# Patient Record
Sex: Male | Born: 1955
Health system: Southern US, Community
[De-identification: ages and names within clinical notes are randomized; demographics above are authoritative.]

---

## 2007-09-17 ENCOUNTER — Ambulatory Visit: Payer: Self-pay | Admitting: Infectious Diseases

## 2007-12-17 ENCOUNTER — Ambulatory Visit: Payer: Self-pay | Admitting: Internal Medicine

## 2008-06-23 ENCOUNTER — Ambulatory Visit: Payer: Self-pay | Admitting: Infectious Diseases

## 2008-12-15 ENCOUNTER — Ambulatory Visit: Payer: Self-pay | Admitting: Internal Medicine

## 2012-03-05 DIAGNOSIS — B20 Human immunodeficiency virus [HIV] disease: Secondary | ICD-10-CM

## 2012-04-08 DIAGNOSIS — B20 Human immunodeficiency virus [HIV] disease: Secondary | ICD-10-CM

## 2012-08-06 DIAGNOSIS — B20 Human immunodeficiency virus [HIV] disease: Secondary | ICD-10-CM

## 2013-02-10 DIAGNOSIS — B20 Human immunodeficiency virus [HIV] disease: Secondary | ICD-10-CM

## 2014-01-06 DIAGNOSIS — B20 Human immunodeficiency virus [HIV] disease: Secondary | ICD-10-CM

## 2014-05-26 DIAGNOSIS — B20 Human immunodeficiency virus [HIV] disease: Secondary | ICD-10-CM

## 2014-06-29 DIAGNOSIS — S42024A Nondisplaced fracture of shaft of right clavicle, initial encounter for closed fracture: Secondary | ICD-10-CM | POA: Diagnosis not present

## 2014-07-12 DIAGNOSIS — I82401 Acute embolism and thrombosis of unspecified deep veins of right lower extremity: Secondary | ICD-10-CM | POA: Diagnosis not present

## 2014-07-12 DIAGNOSIS — E785 Hyperlipidemia, unspecified: Secondary | ICD-10-CM | POA: Diagnosis not present

## 2014-07-12 DIAGNOSIS — E1140 Type 2 diabetes mellitus with diabetic neuropathy, unspecified: Secondary | ICD-10-CM | POA: Diagnosis not present

## 2014-07-12 DIAGNOSIS — E119 Type 2 diabetes mellitus without complications: Secondary | ICD-10-CM | POA: Diagnosis not present

## 2014-07-19 DIAGNOSIS — I82401 Acute embolism and thrombosis of unspecified deep veins of right lower extremity: Secondary | ICD-10-CM | POA: Diagnosis not present

## 2014-07-26 DIAGNOSIS — E1142 Type 2 diabetes mellitus with diabetic polyneuropathy: Secondary | ICD-10-CM | POA: Diagnosis not present

## 2014-07-26 DIAGNOSIS — Z7689 Persons encountering health services in other specified circumstances: Secondary | ICD-10-CM | POA: Diagnosis not present

## 2014-07-26 DIAGNOSIS — G894 Chronic pain syndrome: Secondary | ICD-10-CM | POA: Diagnosis not present

## 2014-07-26 DIAGNOSIS — Z720 Tobacco use: Secondary | ICD-10-CM | POA: Diagnosis not present

## 2014-07-26 DIAGNOSIS — Z5181 Encounter for therapeutic drug level monitoring: Secondary | ICD-10-CM | POA: Diagnosis not present

## 2014-07-26 DIAGNOSIS — M961 Postlaminectomy syndrome, not elsewhere classified: Secondary | ICD-10-CM | POA: Diagnosis not present

## 2014-07-27 DIAGNOSIS — S42024A Nondisplaced fracture of shaft of right clavicle, initial encounter for closed fracture: Secondary | ICD-10-CM | POA: Diagnosis not present

## 2014-09-07 DIAGNOSIS — S42024A Nondisplaced fracture of shaft of right clavicle, initial encounter for closed fracture: Secondary | ICD-10-CM | POA: Diagnosis not present

## 2014-09-22 DIAGNOSIS — M961 Postlaminectomy syndrome, not elsewhere classified: Secondary | ICD-10-CM | POA: Diagnosis not present

## 2014-09-22 DIAGNOSIS — Z720 Tobacco use: Secondary | ICD-10-CM | POA: Diagnosis not present

## 2014-09-22 DIAGNOSIS — E1142 Type 2 diabetes mellitus with diabetic polyneuropathy: Secondary | ICD-10-CM | POA: Diagnosis not present

## 2014-10-10 DIAGNOSIS — E785 Hyperlipidemia, unspecified: Secondary | ICD-10-CM | POA: Diagnosis not present

## 2014-10-10 DIAGNOSIS — E119 Type 2 diabetes mellitus without complications: Secondary | ICD-10-CM | POA: Diagnosis not present

## 2014-10-10 DIAGNOSIS — I82409 Acute embolism and thrombosis of unspecified deep veins of unspecified lower extremity: Secondary | ICD-10-CM | POA: Diagnosis not present

## 2014-10-10 DIAGNOSIS — E1129 Type 2 diabetes mellitus with other diabetic kidney complication: Secondary | ICD-10-CM | POA: Diagnosis not present

## 2014-10-24 DIAGNOSIS — I82409 Acute embolism and thrombosis of unspecified deep veins of unspecified lower extremity: Secondary | ICD-10-CM | POA: Diagnosis not present

## 2014-11-01 DIAGNOSIS — Z23 Encounter for immunization: Secondary | ICD-10-CM | POA: Diagnosis not present

## 2014-11-01 DIAGNOSIS — S91112A Laceration without foreign body of left great toe without damage to nail, initial encounter: Secondary | ICD-10-CM | POA: Diagnosis not present

## 2014-11-03 DIAGNOSIS — Z79899 Other long term (current) drug therapy: Secondary | ICD-10-CM | POA: Diagnosis not present

## 2014-11-03 DIAGNOSIS — B20 Human immunodeficiency virus [HIV] disease: Secondary | ICD-10-CM | POA: Diagnosis not present

## 2014-11-08 DIAGNOSIS — I639 Cerebral infarction, unspecified: Secondary | ICD-10-CM | POA: Diagnosis not present

## 2014-11-10 DIAGNOSIS — Z7901 Long term (current) use of anticoagulants: Secondary | ICD-10-CM | POA: Diagnosis not present

## 2014-11-10 DIAGNOSIS — L089 Local infection of the skin and subcutaneous tissue, unspecified: Secondary | ICD-10-CM | POA: Diagnosis not present

## 2014-11-10 DIAGNOSIS — Z87891 Personal history of nicotine dependence: Secondary | ICD-10-CM | POA: Diagnosis not present

## 2014-11-10 DIAGNOSIS — B20 Human immunodeficiency virus [HIV] disease: Secondary | ICD-10-CM | POA: Diagnosis not present

## 2014-11-10 DIAGNOSIS — E1142 Type 2 diabetes mellitus with diabetic polyneuropathy: Secondary | ICD-10-CM | POA: Diagnosis not present

## 2014-11-10 DIAGNOSIS — Z86718 Personal history of other venous thrombosis and embolism: Secondary | ICD-10-CM | POA: Diagnosis not present

## 2014-11-10 DIAGNOSIS — E781 Pure hyperglyceridemia: Secondary | ICD-10-CM | POA: Diagnosis not present

## 2014-11-10 DIAGNOSIS — R42 Dizziness and giddiness: Secondary | ICD-10-CM | POA: Diagnosis not present

## 2014-11-11 DIAGNOSIS — I639 Cerebral infarction, unspecified: Secondary | ICD-10-CM | POA: Diagnosis not present

## 2014-11-11 DIAGNOSIS — B20 Human immunodeficiency virus [HIV] disease: Secondary | ICD-10-CM | POA: Diagnosis not present

## 2014-11-15 DIAGNOSIS — F17210 Nicotine dependence, cigarettes, uncomplicated: Secondary | ICD-10-CM | POA: Diagnosis not present

## 2014-11-15 DIAGNOSIS — I82513 Chronic embolism and thrombosis of femoral vein, bilateral: Secondary | ICD-10-CM | POA: Diagnosis not present

## 2014-11-15 DIAGNOSIS — J9811 Atelectasis: Secondary | ICD-10-CM | POA: Diagnosis not present

## 2014-11-15 DIAGNOSIS — Z794 Long term (current) use of insulin: Secondary | ICD-10-CM | POA: Diagnosis not present

## 2014-11-15 DIAGNOSIS — L03115 Cellulitis of right lower limb: Secondary | ICD-10-CM | POA: Diagnosis not present

## 2014-11-15 DIAGNOSIS — Z885 Allergy status to narcotic agent status: Secondary | ICD-10-CM | POA: Diagnosis not present

## 2014-11-15 DIAGNOSIS — Z7901 Long term (current) use of anticoagulants: Secondary | ICD-10-CM | POA: Diagnosis not present

## 2014-11-15 DIAGNOSIS — Z21 Asymptomatic human immunodeficiency virus [HIV] infection status: Secondary | ICD-10-CM | POA: Diagnosis not present

## 2014-11-15 DIAGNOSIS — R062 Wheezing: Secondary | ICD-10-CM | POA: Diagnosis not present

## 2014-11-15 DIAGNOSIS — I82413 Acute embolism and thrombosis of femoral vein, bilateral: Secondary | ICD-10-CM | POA: Diagnosis not present

## 2014-11-15 DIAGNOSIS — A419 Sepsis, unspecified organism: Secondary | ICD-10-CM | POA: Diagnosis not present

## 2014-11-15 DIAGNOSIS — T148 Other injury of unspecified body region: Secondary | ICD-10-CM | POA: Diagnosis not present

## 2014-11-15 DIAGNOSIS — L02612 Cutaneous abscess of left foot: Secondary | ICD-10-CM | POA: Diagnosis not present

## 2014-11-15 DIAGNOSIS — I4510 Unspecified right bundle-branch block: Secondary | ICD-10-CM | POA: Diagnosis not present

## 2014-11-15 DIAGNOSIS — Z880 Allergy status to penicillin: Secondary | ICD-10-CM | POA: Diagnosis not present

## 2014-11-15 DIAGNOSIS — T814XXA Infection following a procedure, initial encounter: Secondary | ICD-10-CM | POA: Diagnosis not present

## 2014-11-15 DIAGNOSIS — B20 Human immunodeficiency virus [HIV] disease: Secondary | ICD-10-CM | POA: Diagnosis not present

## 2014-11-15 DIAGNOSIS — E1142 Type 2 diabetes mellitus with diabetic polyneuropathy: Secondary | ICD-10-CM | POA: Diagnosis not present

## 2014-11-15 DIAGNOSIS — L03032 Cellulitis of left toe: Secondary | ICD-10-CM | POA: Diagnosis not present

## 2014-11-15 DIAGNOSIS — L03116 Cellulitis of left lower limb: Secondary | ICD-10-CM | POA: Diagnosis not present

## 2014-11-15 DIAGNOSIS — Z79899 Other long term (current) drug therapy: Secondary | ICD-10-CM | POA: Diagnosis not present

## 2014-11-15 DIAGNOSIS — E780 Pure hypercholesterolemia: Secondary | ICD-10-CM | POA: Diagnosis not present

## 2014-11-15 DIAGNOSIS — S91112A Laceration without foreign body of left great toe without damage to nail, initial encounter: Secondary | ICD-10-CM | POA: Diagnosis not present

## 2014-11-15 DIAGNOSIS — L97529 Non-pressure chronic ulcer of other part of left foot with unspecified severity: Secondary | ICD-10-CM | POA: Diagnosis not present

## 2014-11-23 DIAGNOSIS — G8929 Other chronic pain: Secondary | ICD-10-CM | POA: Diagnosis not present

## 2014-11-23 DIAGNOSIS — Z21 Asymptomatic human immunodeficiency virus [HIV] infection status: Secondary | ICD-10-CM | POA: Diagnosis not present

## 2014-11-23 DIAGNOSIS — T383X5A Adverse effect of insulin and oral hypoglycemic [antidiabetic] drugs, initial encounter: Secondary | ICD-10-CM | POA: Diagnosis not present

## 2014-11-23 DIAGNOSIS — F329 Major depressive disorder, single episode, unspecified: Secondary | ICD-10-CM | POA: Diagnosis not present

## 2014-11-23 DIAGNOSIS — F17210 Nicotine dependence, cigarettes, uncomplicated: Secondary | ICD-10-CM | POA: Diagnosis not present

## 2014-11-23 DIAGNOSIS — L97522 Non-pressure chronic ulcer of other part of left foot with fat layer exposed: Secondary | ICD-10-CM | POA: Diagnosis not present

## 2014-11-23 DIAGNOSIS — E785 Hyperlipidemia, unspecified: Secondary | ICD-10-CM | POA: Diagnosis not present

## 2014-11-23 DIAGNOSIS — Z86718 Personal history of other venous thrombosis and embolism: Secondary | ICD-10-CM | POA: Diagnosis not present

## 2014-11-23 DIAGNOSIS — E1140 Type 2 diabetes mellitus with diabetic neuropathy, unspecified: Secondary | ICD-10-CM | POA: Diagnosis not present

## 2014-11-23 DIAGNOSIS — B20 Human immunodeficiency virus [HIV] disease: Secondary | ICD-10-CM | POA: Diagnosis not present

## 2014-11-23 DIAGNOSIS — G4733 Obstructive sleep apnea (adult) (pediatric): Secondary | ICD-10-CM | POA: Diagnosis not present

## 2014-11-23 DIAGNOSIS — L97529 Non-pressure chronic ulcer of other part of left foot with unspecified severity: Secondary | ICD-10-CM | POA: Diagnosis not present

## 2014-11-23 DIAGNOSIS — R4020 Unspecified coma: Secondary | ICD-10-CM | POA: Diagnosis not present

## 2014-11-23 DIAGNOSIS — E876 Hypokalemia: Secondary | ICD-10-CM | POA: Diagnosis not present

## 2014-11-23 DIAGNOSIS — D72829 Elevated white blood cell count, unspecified: Secondary | ICD-10-CM | POA: Diagnosis not present

## 2014-11-23 DIAGNOSIS — S91109A Unspecified open wound of unspecified toe(s) without damage to nail, initial encounter: Secondary | ICD-10-CM | POA: Diagnosis not present

## 2014-11-23 DIAGNOSIS — S0083XA Contusion of other part of head, initial encounter: Secondary | ICD-10-CM | POA: Diagnosis not present

## 2014-11-23 DIAGNOSIS — G9341 Metabolic encephalopathy: Secondary | ICD-10-CM | POA: Diagnosis not present

## 2014-11-23 DIAGNOSIS — I10 Essential (primary) hypertension: Secondary | ICD-10-CM | POA: Diagnosis not present

## 2014-11-23 DIAGNOSIS — M6282 Rhabdomyolysis: Secondary | ICD-10-CM | POA: Diagnosis not present

## 2014-11-23 DIAGNOSIS — E11649 Type 2 diabetes mellitus with hypoglycemia without coma: Secondary | ICD-10-CM | POA: Diagnosis not present

## 2014-11-23 DIAGNOSIS — E1142 Type 2 diabetes mellitus with diabetic polyneuropathy: Secondary | ICD-10-CM | POA: Diagnosis not present

## 2014-11-23 DIAGNOSIS — E162 Hypoglycemia, unspecified: Secondary | ICD-10-CM | POA: Diagnosis not present

## 2014-11-23 DIAGNOSIS — Z880 Allergy status to penicillin: Secondary | ICD-10-CM | POA: Diagnosis not present

## 2014-11-23 DIAGNOSIS — E160 Drug-induced hypoglycemia without coma: Secondary | ICD-10-CM | POA: Diagnosis not present

## 2014-11-23 DIAGNOSIS — J9811 Atelectasis: Secondary | ICD-10-CM | POA: Diagnosis not present

## 2014-11-23 DIAGNOSIS — R220 Localized swelling, mass and lump, head: Secondary | ICD-10-CM | POA: Diagnosis not present

## 2014-11-29 DIAGNOSIS — L089 Local infection of the skin and subcutaneous tissue, unspecified: Secondary | ICD-10-CM | POA: Diagnosis not present

## 2014-11-29 DIAGNOSIS — M961 Postlaminectomy syndrome, not elsewhere classified: Secondary | ICD-10-CM | POA: Diagnosis not present

## 2014-11-29 DIAGNOSIS — E1142 Type 2 diabetes mellitus with diabetic polyneuropathy: Secondary | ICD-10-CM | POA: Diagnosis not present

## 2014-11-29 DIAGNOSIS — Z720 Tobacco use: Secondary | ICD-10-CM | POA: Diagnosis not present

## 2014-12-01 DIAGNOSIS — E1142 Type 2 diabetes mellitus with diabetic polyneuropathy: Secondary | ICD-10-CM | POA: Diagnosis not present

## 2014-12-01 DIAGNOSIS — E781 Pure hyperglyceridemia: Secondary | ICD-10-CM | POA: Diagnosis not present

## 2014-12-01 DIAGNOSIS — R42 Dizziness and giddiness: Secondary | ICD-10-CM | POA: Diagnosis not present

## 2014-12-01 DIAGNOSIS — Z7901 Long term (current) use of anticoagulants: Secondary | ICD-10-CM | POA: Diagnosis not present

## 2014-12-01 DIAGNOSIS — B20 Human immunodeficiency virus [HIV] disease: Secondary | ICD-10-CM | POA: Diagnosis not present

## 2014-12-01 DIAGNOSIS — Z87891 Personal history of nicotine dependence: Secondary | ICD-10-CM | POA: Diagnosis not present

## 2014-12-01 DIAGNOSIS — Z86718 Personal history of other venous thrombosis and embolism: Secondary | ICD-10-CM | POA: Diagnosis not present

## 2014-12-01 DIAGNOSIS — L089 Local infection of the skin and subcutaneous tissue, unspecified: Secondary | ICD-10-CM | POA: Diagnosis not present

## 2014-12-02 DIAGNOSIS — B20 Human immunodeficiency virus [HIV] disease: Secondary | ICD-10-CM | POA: Diagnosis not present

## 2014-12-09 DIAGNOSIS — L03032 Cellulitis of left toe: Secondary | ICD-10-CM | POA: Diagnosis not present

## 2014-12-09 DIAGNOSIS — E119 Type 2 diabetes mellitus without complications: Secondary | ICD-10-CM | POA: Diagnosis not present

## 2014-12-23 DIAGNOSIS — E1165 Type 2 diabetes mellitus with hyperglycemia: Secondary | ICD-10-CM | POA: Diagnosis not present

## 2014-12-23 DIAGNOSIS — I82409 Acute embolism and thrombosis of unspecified deep veins of unspecified lower extremity: Secondary | ICD-10-CM | POA: Diagnosis not present

## 2014-12-26 DIAGNOSIS — I639 Cerebral infarction, unspecified: Secondary | ICD-10-CM | POA: Diagnosis not present

## 2014-12-30 DIAGNOSIS — I639 Cerebral infarction, unspecified: Secondary | ICD-10-CM | POA: Diagnosis not present

## 2014-12-30 DIAGNOSIS — I82409 Acute embolism and thrombosis of unspecified deep veins of unspecified lower extremity: Secondary | ICD-10-CM | POA: Diagnosis not present

## 2015-01-03 DIAGNOSIS — I82409 Acute embolism and thrombosis of unspecified deep veins of unspecified lower extremity: Secondary | ICD-10-CM | POA: Diagnosis not present

## 2015-01-03 DIAGNOSIS — I639 Cerebral infarction, unspecified: Secondary | ICD-10-CM | POA: Diagnosis not present

## 2015-01-16 DIAGNOSIS — I82502 Chronic embolism and thrombosis of unspecified deep veins of left lower extremity: Secondary | ICD-10-CM | POA: Diagnosis not present

## 2015-01-20 DIAGNOSIS — I82502 Chronic embolism and thrombosis of unspecified deep veins of left lower extremity: Secondary | ICD-10-CM | POA: Diagnosis not present

## 2015-01-25 DIAGNOSIS — I82502 Chronic embolism and thrombosis of unspecified deep veins of left lower extremity: Secondary | ICD-10-CM | POA: Diagnosis not present

## 2015-01-30 DIAGNOSIS — G894 Chronic pain syndrome: Secondary | ICD-10-CM | POA: Diagnosis not present

## 2015-01-30 DIAGNOSIS — Z7689 Persons encountering health services in other specified circumstances: Secondary | ICD-10-CM | POA: Diagnosis not present

## 2015-01-31 DIAGNOSIS — I82502 Chronic embolism and thrombosis of unspecified deep veins of left lower extremity: Secondary | ICD-10-CM | POA: Diagnosis not present

## 2015-03-27 DIAGNOSIS — I82511 Chronic embolism and thrombosis of right femoral vein: Secondary | ICD-10-CM | POA: Diagnosis not present

## 2015-03-27 DIAGNOSIS — E1169 Type 2 diabetes mellitus with other specified complication: Secondary | ICD-10-CM | POA: Diagnosis not present

## 2015-03-27 DIAGNOSIS — E1129 Type 2 diabetes mellitus with other diabetic kidney complication: Secondary | ICD-10-CM | POA: Diagnosis not present

## 2015-03-27 DIAGNOSIS — R079 Chest pain, unspecified: Secondary | ICD-10-CM | POA: Diagnosis not present

## 2015-03-27 DIAGNOSIS — Z23 Encounter for immunization: Secondary | ICD-10-CM | POA: Diagnosis not present

## 2015-03-27 DIAGNOSIS — F330 Major depressive disorder, recurrent, mild: Secondary | ICD-10-CM | POA: Diagnosis not present

## 2015-03-27 DIAGNOSIS — R809 Proteinuria, unspecified: Secondary | ICD-10-CM | POA: Diagnosis not present

## 2015-03-28 DIAGNOSIS — E1142 Type 2 diabetes mellitus with diabetic polyneuropathy: Secondary | ICD-10-CM | POA: Diagnosis not present

## 2015-03-28 DIAGNOSIS — F17200 Nicotine dependence, unspecified, uncomplicated: Secondary | ICD-10-CM | POA: Diagnosis not present

## 2015-03-28 DIAGNOSIS — M961 Postlaminectomy syndrome, not elsewhere classified: Secondary | ICD-10-CM | POA: Diagnosis not present

## 2015-05-17 DIAGNOSIS — F17210 Nicotine dependence, cigarettes, uncomplicated: Secondary | ICD-10-CM | POA: Diagnosis not present

## 2015-05-17 DIAGNOSIS — R55 Syncope and collapse: Secondary | ICD-10-CM | POA: Diagnosis not present

## 2015-05-17 DIAGNOSIS — Z79899 Other long term (current) drug therapy: Secondary | ICD-10-CM | POA: Diagnosis not present

## 2015-05-17 DIAGNOSIS — R079 Chest pain, unspecified: Secondary | ICD-10-CM | POA: Diagnosis not present

## 2015-05-17 DIAGNOSIS — E1165 Type 2 diabetes mellitus with hyperglycemia: Secondary | ICD-10-CM | POA: Diagnosis not present

## 2015-05-17 DIAGNOSIS — Z7901 Long term (current) use of anticoagulants: Secondary | ICD-10-CM | POA: Diagnosis not present

## 2015-05-17 DIAGNOSIS — Z7984 Long term (current) use of oral hypoglycemic drugs: Secondary | ICD-10-CM | POA: Diagnosis not present

## 2015-05-17 DIAGNOSIS — I82513 Chronic embolism and thrombosis of femoral vein, bilateral: Secondary | ICD-10-CM | POA: Diagnosis not present

## 2015-05-17 DIAGNOSIS — B20 Human immunodeficiency virus [HIV] disease: Secondary | ICD-10-CM | POA: Diagnosis not present

## 2015-05-17 DIAGNOSIS — E1142 Type 2 diabetes mellitus with diabetic polyneuropathy: Secondary | ICD-10-CM | POA: Diagnosis not present

## 2015-05-17 DIAGNOSIS — R0602 Shortness of breath: Secondary | ICD-10-CM | POA: Diagnosis not present

## 2015-05-17 DIAGNOSIS — E784 Other hyperlipidemia: Secondary | ICD-10-CM | POA: Diagnosis not present

## 2015-05-17 DIAGNOSIS — Z716 Tobacco abuse counseling: Secondary | ICD-10-CM | POA: Diagnosis not present

## 2015-05-17 DIAGNOSIS — E1129 Type 2 diabetes mellitus with other diabetic kidney complication: Secondary | ICD-10-CM | POA: Diagnosis not present

## 2015-05-17 DIAGNOSIS — G4733 Obstructive sleep apnea (adult) (pediatric): Secondary | ICD-10-CM | POA: Diagnosis not present

## 2015-05-17 DIAGNOSIS — R072 Precordial pain: Secondary | ICD-10-CM | POA: Diagnosis not present

## 2015-05-17 DIAGNOSIS — R809 Proteinuria, unspecified: Secondary | ICD-10-CM | POA: Diagnosis not present

## 2015-05-18 DIAGNOSIS — Z7901 Long term (current) use of anticoagulants: Secondary | ICD-10-CM | POA: Diagnosis not present

## 2015-05-18 DIAGNOSIS — R55 Syncope and collapse: Secondary | ICD-10-CM | POA: Diagnosis not present

## 2015-05-18 DIAGNOSIS — R079 Chest pain, unspecified: Secondary | ICD-10-CM | POA: Diagnosis not present

## 2015-05-18 DIAGNOSIS — I82413 Acute embolism and thrombosis of femoral vein, bilateral: Secondary | ICD-10-CM | POA: Diagnosis not present

## 2015-05-19 DIAGNOSIS — R079 Chest pain, unspecified: Secondary | ICD-10-CM | POA: Diagnosis not present

## 2015-05-19 DIAGNOSIS — I82513 Chronic embolism and thrombosis of femoral vein, bilateral: Secondary | ICD-10-CM | POA: Diagnosis not present

## 2015-05-19 DIAGNOSIS — E1142 Type 2 diabetes mellitus with diabetic polyneuropathy: Secondary | ICD-10-CM | POA: Diagnosis not present

## 2015-05-19 DIAGNOSIS — R55 Syncope and collapse: Secondary | ICD-10-CM | POA: Diagnosis not present

## 2015-05-19 DIAGNOSIS — B20 Human immunodeficiency virus [HIV] disease: Secondary | ICD-10-CM | POA: Diagnosis not present

## 2015-05-19 DIAGNOSIS — R0602 Shortness of breath: Secondary | ICD-10-CM | POA: Diagnosis not present

## 2015-05-31 DIAGNOSIS — M961 Postlaminectomy syndrome, not elsewhere classified: Secondary | ICD-10-CM | POA: Diagnosis not present

## 2015-05-31 DIAGNOSIS — E1142 Type 2 diabetes mellitus with diabetic polyneuropathy: Secondary | ICD-10-CM | POA: Diagnosis not present

## 2015-05-31 DIAGNOSIS — F17200 Nicotine dependence, unspecified, uncomplicated: Secondary | ICD-10-CM | POA: Diagnosis not present

## 2015-06-29 DIAGNOSIS — Z0000 Encounter for general adult medical examination without abnormal findings: Secondary | ICD-10-CM | POA: Diagnosis not present

## 2015-06-29 DIAGNOSIS — E1140 Type 2 diabetes mellitus with diabetic neuropathy, unspecified: Secondary | ICD-10-CM | POA: Diagnosis not present

## 2015-06-29 DIAGNOSIS — N400 Enlarged prostate without lower urinary tract symptoms: Secondary | ICD-10-CM | POA: Diagnosis not present

## 2015-06-29 DIAGNOSIS — E7800 Pure hypercholesterolemia, unspecified: Secondary | ICD-10-CM | POA: Diagnosis not present

## 2015-06-29 DIAGNOSIS — Z87891 Personal history of nicotine dependence: Secondary | ICD-10-CM | POA: Diagnosis not present

## 2015-06-29 DIAGNOSIS — I82511 Chronic embolism and thrombosis of right femoral vein: Secondary | ICD-10-CM | POA: Diagnosis not present

## 2015-06-29 DIAGNOSIS — I10 Essential (primary) hypertension: Secondary | ICD-10-CM | POA: Diagnosis not present

## 2015-07-10 DIAGNOSIS — E7800 Pure hypercholesterolemia, unspecified: Secondary | ICD-10-CM | POA: Diagnosis not present

## 2015-07-10 DIAGNOSIS — E1140 Type 2 diabetes mellitus with diabetic neuropathy, unspecified: Secondary | ICD-10-CM | POA: Diagnosis not present

## 2015-07-10 DIAGNOSIS — N400 Enlarged prostate without lower urinary tract symptoms: Secondary | ICD-10-CM | POA: Diagnosis not present

## 2015-07-10 DIAGNOSIS — B20 Human immunodeficiency virus [HIV] disease: Secondary | ICD-10-CM | POA: Diagnosis not present

## 2015-07-10 DIAGNOSIS — I82511 Chronic embolism and thrombosis of right femoral vein: Secondary | ICD-10-CM | POA: Diagnosis not present

## 2015-07-10 DIAGNOSIS — Z79899 Other long term (current) drug therapy: Secondary | ICD-10-CM | POA: Diagnosis not present

## 2015-07-26 DIAGNOSIS — K5900 Constipation, unspecified: Secondary | ICD-10-CM | POA: Diagnosis not present

## 2015-07-26 DIAGNOSIS — E1142 Type 2 diabetes mellitus with diabetic polyneuropathy: Secondary | ICD-10-CM | POA: Diagnosis not present

## 2015-07-26 DIAGNOSIS — M961 Postlaminectomy syndrome, not elsewhere classified: Secondary | ICD-10-CM | POA: Diagnosis not present

## 2015-08-29 DIAGNOSIS — K5904 Chronic idiopathic constipation: Secondary | ICD-10-CM | POA: Diagnosis not present

## 2015-08-29 DIAGNOSIS — Z800 Family history of malignant neoplasm of digestive organs: Secondary | ICD-10-CM | POA: Diagnosis not present

## 2015-09-21 DIAGNOSIS — B20 Human immunodeficiency virus [HIV] disease: Secondary | ICD-10-CM | POA: Diagnosis not present

## 2015-09-27 DIAGNOSIS — E1142 Type 2 diabetes mellitus with diabetic polyneuropathy: Secondary | ICD-10-CM | POA: Diagnosis not present

## 2015-09-27 DIAGNOSIS — M961 Postlaminectomy syndrome, not elsewhere classified: Secondary | ICD-10-CM | POA: Diagnosis not present

## 2015-09-28 DIAGNOSIS — K648 Other hemorrhoids: Secondary | ICD-10-CM | POA: Diagnosis not present

## 2015-09-28 DIAGNOSIS — Z800 Family history of malignant neoplasm of digestive organs: Secondary | ICD-10-CM | POA: Diagnosis not present

## 2015-09-28 DIAGNOSIS — B20 Human immunodeficiency virus [HIV] disease: Secondary | ICD-10-CM | POA: Diagnosis not present

## 2015-09-28 DIAGNOSIS — D125 Benign neoplasm of sigmoid colon: Secondary | ICD-10-CM | POA: Diagnosis not present

## 2015-09-28 DIAGNOSIS — D124 Benign neoplasm of descending colon: Secondary | ICD-10-CM | POA: Diagnosis not present

## 2015-09-28 DIAGNOSIS — K5904 Chronic idiopathic constipation: Secondary | ICD-10-CM | POA: Diagnosis not present

## 2015-09-28 DIAGNOSIS — Z794 Long term (current) use of insulin: Secondary | ICD-10-CM | POA: Diagnosis not present

## 2015-09-28 DIAGNOSIS — I82409 Acute embolism and thrombosis of unspecified deep veins of unspecified lower extremity: Secondary | ICD-10-CM | POA: Diagnosis not present

## 2015-09-28 DIAGNOSIS — D127 Benign neoplasm of rectosigmoid junction: Secondary | ICD-10-CM | POA: Diagnosis not present

## 2015-09-28 DIAGNOSIS — Z1211 Encounter for screening for malignant neoplasm of colon: Secondary | ICD-10-CM | POA: Diagnosis not present

## 2015-09-28 DIAGNOSIS — K219 Gastro-esophageal reflux disease without esophagitis: Secondary | ICD-10-CM | POA: Diagnosis not present

## 2015-09-28 DIAGNOSIS — Z79899 Other long term (current) drug therapy: Secondary | ICD-10-CM | POA: Diagnosis not present

## 2015-09-28 DIAGNOSIS — G4733 Obstructive sleep apnea (adult) (pediatric): Secondary | ICD-10-CM | POA: Diagnosis not present

## 2015-09-28 DIAGNOSIS — K5730 Diverticulosis of large intestine without perforation or abscess without bleeding: Secondary | ICD-10-CM | POA: Diagnosis not present

## 2015-09-28 DIAGNOSIS — E7800 Pure hypercholesterolemia, unspecified: Secondary | ICD-10-CM | POA: Diagnosis not present

## 2015-09-28 DIAGNOSIS — Z7901 Long term (current) use of anticoagulants: Secondary | ICD-10-CM | POA: Diagnosis not present

## 2015-09-28 DIAGNOSIS — K635 Polyp of colon: Secondary | ICD-10-CM | POA: Diagnosis not present

## 2015-09-28 DIAGNOSIS — Z9049 Acquired absence of other specified parts of digestive tract: Secondary | ICD-10-CM | POA: Diagnosis not present

## 2015-09-28 DIAGNOSIS — E1140 Type 2 diabetes mellitus with diabetic neuropathy, unspecified: Secondary | ICD-10-CM | POA: Diagnosis not present

## 2015-09-28 DIAGNOSIS — I10 Essential (primary) hypertension: Secondary | ICD-10-CM | POA: Diagnosis not present

## 2015-09-28 DIAGNOSIS — J449 Chronic obstructive pulmonary disease, unspecified: Secondary | ICD-10-CM | POA: Diagnosis not present

## 2015-09-28 DIAGNOSIS — Z8619 Personal history of other infectious and parasitic diseases: Secondary | ICD-10-CM | POA: Diagnosis not present

## 2015-10-03 DIAGNOSIS — K5901 Slow transit constipation: Secondary | ICD-10-CM | POA: Diagnosis not present

## 2015-10-03 DIAGNOSIS — E119 Type 2 diabetes mellitus without complications: Secondary | ICD-10-CM | POA: Diagnosis not present

## 2015-11-27 DIAGNOSIS — G894 Chronic pain syndrome: Secondary | ICD-10-CM | POA: Diagnosis not present

## 2016-01-05 DIAGNOSIS — E1140 Type 2 diabetes mellitus with diabetic neuropathy, unspecified: Secondary | ICD-10-CM | POA: Diagnosis not present

## 2016-01-05 DIAGNOSIS — R809 Proteinuria, unspecified: Secondary | ICD-10-CM | POA: Diagnosis not present

## 2016-01-05 DIAGNOSIS — E1129 Type 2 diabetes mellitus with other diabetic kidney complication: Secondary | ICD-10-CM | POA: Diagnosis not present

## 2016-03-04 DIAGNOSIS — Z23 Encounter for immunization: Secondary | ICD-10-CM | POA: Diagnosis not present

## 2016-03-21 DIAGNOSIS — R55 Syncope and collapse: Secondary | ICD-10-CM | POA: Diagnosis not present

## 2016-03-21 DIAGNOSIS — E119 Type 2 diabetes mellitus without complications: Secondary | ICD-10-CM | POA: Diagnosis not present

## 2016-04-05 DIAGNOSIS — B20 Human immunodeficiency virus [HIV] disease: Secondary | ICD-10-CM | POA: Diagnosis not present

## 2016-04-08 DIAGNOSIS — M5136 Other intervertebral disc degeneration, lumbar region: Secondary | ICD-10-CM | POA: Diagnosis not present

## 2016-04-08 DIAGNOSIS — Z5181 Encounter for therapeutic drug level monitoring: Secondary | ICD-10-CM | POA: Diagnosis not present

## 2016-04-08 DIAGNOSIS — M961 Postlaminectomy syndrome, not elsewhere classified: Secondary | ICD-10-CM | POA: Diagnosis not present

## 2016-04-08 DIAGNOSIS — G603 Idiopathic progressive neuropathy: Secondary | ICD-10-CM | POA: Diagnosis not present

## 2016-05-05 DIAGNOSIS — R911 Solitary pulmonary nodule: Secondary | ICD-10-CM | POA: Diagnosis not present

## 2016-05-05 DIAGNOSIS — E1142 Type 2 diabetes mellitus with diabetic polyneuropathy: Secondary | ICD-10-CM | POA: Diagnosis not present

## 2016-05-05 DIAGNOSIS — K8020 Calculus of gallbladder without cholecystitis without obstruction: Secondary | ICD-10-CM | POA: Diagnosis not present

## 2016-05-05 DIAGNOSIS — I10 Essential (primary) hypertension: Secondary | ICD-10-CM | POA: Diagnosis not present

## 2016-05-05 DIAGNOSIS — R112 Nausea with vomiting, unspecified: Secondary | ICD-10-CM | POA: Diagnosis not present

## 2016-05-05 DIAGNOSIS — E785 Hyperlipidemia, unspecified: Secondary | ICD-10-CM | POA: Diagnosis not present

## 2016-05-05 DIAGNOSIS — Z23 Encounter for immunization: Secondary | ICD-10-CM | POA: Diagnosis not present

## 2016-05-05 DIAGNOSIS — K2970 Gastritis, unspecified, without bleeding: Secondary | ICD-10-CM | POA: Diagnosis not present

## 2016-05-05 DIAGNOSIS — Z86718 Personal history of other venous thrombosis and embolism: Secondary | ICD-10-CM | POA: Diagnosis not present

## 2016-05-05 DIAGNOSIS — Z7984 Long term (current) use of oral hypoglycemic drugs: Secondary | ICD-10-CM | POA: Diagnosis not present

## 2016-05-05 DIAGNOSIS — N179 Acute kidney failure, unspecified: Secondary | ICD-10-CM | POA: Diagnosis not present

## 2016-05-05 DIAGNOSIS — J82 Pulmonary eosinophilia, not elsewhere classified: Secondary | ICD-10-CM | POA: Diagnosis not present

## 2016-05-05 DIAGNOSIS — K920 Hematemesis: Secondary | ICD-10-CM | POA: Diagnosis not present

## 2016-05-05 DIAGNOSIS — G894 Chronic pain syndrome: Secondary | ICD-10-CM | POA: Diagnosis not present

## 2016-05-05 DIAGNOSIS — J690 Pneumonitis due to inhalation of food and vomit: Secondary | ICD-10-CM | POA: Diagnosis not present

## 2016-05-05 DIAGNOSIS — I209 Angina pectoris, unspecified: Secondary | ICD-10-CM | POA: Diagnosis not present

## 2016-05-05 DIAGNOSIS — R918 Other nonspecific abnormal finding of lung field: Secondary | ICD-10-CM | POA: Diagnosis not present

## 2016-05-05 DIAGNOSIS — J189 Pneumonia, unspecified organism: Secondary | ICD-10-CM | POA: Diagnosis not present

## 2016-05-05 DIAGNOSIS — Z880 Allergy status to penicillin: Secondary | ICD-10-CM | POA: Diagnosis not present

## 2016-05-05 DIAGNOSIS — T402X5A Adverse effect of other opioids, initial encounter: Secondary | ICD-10-CM | POA: Diagnosis not present

## 2016-05-05 DIAGNOSIS — D72829 Elevated white blood cell count, unspecified: Secondary | ICD-10-CM | POA: Diagnosis not present

## 2016-05-05 DIAGNOSIS — G8929 Other chronic pain: Secondary | ICD-10-CM | POA: Diagnosis not present

## 2016-05-05 DIAGNOSIS — Z7901 Long term (current) use of anticoagulants: Secondary | ICD-10-CM | POA: Diagnosis not present

## 2016-05-05 DIAGNOSIS — K5903 Drug induced constipation: Secondary | ICD-10-CM | POA: Diagnosis not present

## 2016-05-05 DIAGNOSIS — R110 Nausea: Secondary | ICD-10-CM | POA: Diagnosis not present

## 2016-05-20 DIAGNOSIS — R112 Nausea with vomiting, unspecified: Secondary | ICD-10-CM | POA: Diagnosis not present

## 2016-05-20 DIAGNOSIS — R0602 Shortness of breath: Secondary | ICD-10-CM | POA: Diagnosis not present

## 2016-05-20 DIAGNOSIS — Z7901 Long term (current) use of anticoagulants: Secondary | ICD-10-CM | POA: Diagnosis not present

## 2016-05-20 DIAGNOSIS — Z86718 Personal history of other venous thrombosis and embolism: Secondary | ICD-10-CM | POA: Diagnosis not present

## 2016-05-20 DIAGNOSIS — E872 Acidosis: Secondary | ICD-10-CM | POA: Diagnosis not present

## 2016-05-20 DIAGNOSIS — M79606 Pain in leg, unspecified: Secondary | ICD-10-CM | POA: Diagnosis not present

## 2016-05-20 DIAGNOSIS — R55 Syncope and collapse: Secondary | ICD-10-CM | POA: Diagnosis not present

## 2016-05-20 DIAGNOSIS — E1149 Type 2 diabetes mellitus with other diabetic neurological complication: Secondary | ICD-10-CM | POA: Diagnosis not present

## 2016-05-20 DIAGNOSIS — I10 Essential (primary) hypertension: Secondary | ICD-10-CM | POA: Diagnosis not present

## 2016-05-20 DIAGNOSIS — N179 Acute kidney failure, unspecified: Secondary | ICD-10-CM | POA: Diagnosis not present

## 2016-05-20 DIAGNOSIS — D72829 Elevated white blood cell count, unspecified: Secondary | ICD-10-CM | POA: Diagnosis not present

## 2016-05-20 DIAGNOSIS — E785 Hyperlipidemia, unspecified: Secondary | ICD-10-CM | POA: Diagnosis not present

## 2016-05-20 DIAGNOSIS — R339 Retention of urine, unspecified: Secondary | ICD-10-CM | POA: Diagnosis not present

## 2016-05-20 DIAGNOSIS — S0990XA Unspecified injury of head, initial encounter: Secondary | ICD-10-CM | POA: Diagnosis not present

## 2016-05-20 DIAGNOSIS — Z880 Allergy status to penicillin: Secondary | ICD-10-CM | POA: Diagnosis not present

## 2016-05-20 DIAGNOSIS — G92 Toxic encephalopathy: Secondary | ICD-10-CM | POA: Diagnosis not present

## 2016-05-20 DIAGNOSIS — E11649 Type 2 diabetes mellitus with hypoglycemia without coma: Secondary | ICD-10-CM | POA: Diagnosis not present

## 2016-05-20 DIAGNOSIS — Z8669 Personal history of other diseases of the nervous system and sense organs: Secondary | ICD-10-CM | POA: Diagnosis not present

## 2016-05-20 DIAGNOSIS — I440 Atrioventricular block, first degree: Secondary | ICD-10-CM | POA: Diagnosis not present

## 2016-05-27 DIAGNOSIS — I82411 Acute embolism and thrombosis of right femoral vein: Secondary | ICD-10-CM | POA: Diagnosis not present

## 2016-06-05 DIAGNOSIS — M5136 Other intervertebral disc degeneration, lumbar region: Secondary | ICD-10-CM | POA: Diagnosis not present

## 2016-06-05 DIAGNOSIS — G894 Chronic pain syndrome: Secondary | ICD-10-CM | POA: Diagnosis not present

## 2016-06-05 DIAGNOSIS — M961 Postlaminectomy syndrome, not elsewhere classified: Secondary | ICD-10-CM | POA: Diagnosis not present

## 2016-06-05 DIAGNOSIS — M48062 Spinal stenosis, lumbar region with neurogenic claudication: Secondary | ICD-10-CM | POA: Diagnosis not present

## 2016-06-05 DIAGNOSIS — E1142 Type 2 diabetes mellitus with diabetic polyneuropathy: Secondary | ICD-10-CM | POA: Diagnosis not present

## 2016-06-26 DIAGNOSIS — Z794 Long term (current) use of insulin: Secondary | ICD-10-CM | POA: Diagnosis not present

## 2016-06-26 DIAGNOSIS — J159 Unspecified bacterial pneumonia: Secondary | ICD-10-CM | POA: Diagnosis not present

## 2016-06-26 DIAGNOSIS — R339 Retention of urine, unspecified: Secondary | ICD-10-CM | POA: Diagnosis not present

## 2016-06-26 DIAGNOSIS — I517 Cardiomegaly: Secondary | ICD-10-CM | POA: Diagnosis not present

## 2016-06-26 DIAGNOSIS — Z79891 Long term (current) use of opiate analgesic: Secondary | ICD-10-CM | POA: Diagnosis not present

## 2016-06-26 DIAGNOSIS — J189 Pneumonia, unspecified organism: Secondary | ICD-10-CM | POA: Diagnosis not present

## 2016-06-26 DIAGNOSIS — B20 Human immunodeficiency virus [HIV] disease: Secondary | ICD-10-CM | POA: Diagnosis not present

## 2016-06-26 DIAGNOSIS — N179 Acute kidney failure, unspecified: Secondary | ICD-10-CM | POA: Diagnosis not present

## 2016-06-26 DIAGNOSIS — I10 Essential (primary) hypertension: Secondary | ICD-10-CM | POA: Diagnosis not present

## 2016-06-26 DIAGNOSIS — Z87891 Personal history of nicotine dependence: Secondary | ICD-10-CM | POA: Diagnosis not present

## 2016-06-26 DIAGNOSIS — R6520 Severe sepsis without septic shock: Secondary | ICD-10-CM | POA: Diagnosis not present

## 2016-06-26 DIAGNOSIS — R338 Other retention of urine: Secondary | ICD-10-CM | POA: Diagnosis not present

## 2016-06-26 DIAGNOSIS — R402411 Glasgow coma scale score 13-15, in the field [EMT or ambulance]: Secondary | ICD-10-CM | POA: Diagnosis not present

## 2016-06-26 DIAGNOSIS — R0602 Shortness of breath: Secondary | ICD-10-CM | POA: Diagnosis not present

## 2016-06-26 DIAGNOSIS — E1142 Type 2 diabetes mellitus with diabetic polyneuropathy: Secondary | ICD-10-CM | POA: Diagnosis not present

## 2016-06-26 DIAGNOSIS — J90 Pleural effusion, not elsewhere classified: Secondary | ICD-10-CM | POA: Diagnosis not present

## 2016-06-26 DIAGNOSIS — J9601 Acute respiratory failure with hypoxia: Secondary | ICD-10-CM | POA: Diagnosis not present

## 2016-06-26 DIAGNOSIS — R3915 Urgency of urination: Secondary | ICD-10-CM | POA: Diagnosis not present

## 2016-06-26 DIAGNOSIS — Z86718 Personal history of other venous thrombosis and embolism: Secondary | ICD-10-CM | POA: Diagnosis not present

## 2016-06-26 DIAGNOSIS — J181 Lobar pneumonia, unspecified organism: Secondary | ICD-10-CM | POA: Diagnosis not present

## 2016-06-26 DIAGNOSIS — N401 Enlarged prostate with lower urinary tract symptoms: Secondary | ICD-10-CM | POA: Diagnosis not present

## 2016-06-26 DIAGNOSIS — A419 Sepsis, unspecified organism: Secondary | ICD-10-CM | POA: Diagnosis not present

## 2016-06-26 DIAGNOSIS — G8929 Other chronic pain: Secondary | ICD-10-CM | POA: Diagnosis not present

## 2016-06-28 DIAGNOSIS — N179 Acute kidney failure, unspecified: Secondary | ICD-10-CM

## 2016-06-28 DIAGNOSIS — A419 Sepsis, unspecified organism: Secondary | ICD-10-CM | POA: Diagnosis not present

## 2016-06-28 DIAGNOSIS — R339 Retention of urine, unspecified: Secondary | ICD-10-CM

## 2016-06-28 DIAGNOSIS — J159 Unspecified bacterial pneumonia: Secondary | ICD-10-CM | POA: Diagnosis not present

## 2016-06-28 DIAGNOSIS — B20 Human immunodeficiency virus [HIV] disease: Secondary | ICD-10-CM | POA: Diagnosis not present

## 2016-06-28 DIAGNOSIS — E1142 Type 2 diabetes mellitus with diabetic polyneuropathy: Secondary | ICD-10-CM | POA: Diagnosis not present

## 2016-07-03 DIAGNOSIS — E162 Hypoglycemia, unspecified: Secondary | ICD-10-CM | POA: Diagnosis not present

## 2016-07-03 DIAGNOSIS — J189 Pneumonia, unspecified organism: Secondary | ICD-10-CM | POA: Diagnosis not present

## 2016-07-03 DIAGNOSIS — E161 Other hypoglycemia: Secondary | ICD-10-CM | POA: Diagnosis not present

## 2016-07-04 DIAGNOSIS — E11649 Type 2 diabetes mellitus with hypoglycemia without coma: Secondary | ICD-10-CM | POA: Diagnosis not present

## 2016-07-04 DIAGNOSIS — E161 Other hypoglycemia: Secondary | ICD-10-CM | POA: Diagnosis not present

## 2016-07-04 DIAGNOSIS — E162 Hypoglycemia, unspecified: Secondary | ICD-10-CM | POA: Diagnosis not present

## 2016-07-04 DIAGNOSIS — I10 Essential (primary) hypertension: Secondary | ICD-10-CM | POA: Diagnosis not present

## 2016-07-12 DIAGNOSIS — E119 Type 2 diabetes mellitus without complications: Secondary | ICD-10-CM | POA: Diagnosis not present

## 2016-08-05 DIAGNOSIS — M961 Postlaminectomy syndrome, not elsewhere classified: Secondary | ICD-10-CM | POA: Diagnosis not present

## 2016-08-05 DIAGNOSIS — E1142 Type 2 diabetes mellitus with diabetic polyneuropathy: Secondary | ICD-10-CM | POA: Diagnosis not present

## 2016-08-05 DIAGNOSIS — M48062 Spinal stenosis, lumbar region with neurogenic claudication: Secondary | ICD-10-CM | POA: Diagnosis not present

## 2016-08-05 DIAGNOSIS — M5136 Other intervertebral disc degeneration, lumbar region: Secondary | ICD-10-CM | POA: Diagnosis not present

## 2016-08-08 DIAGNOSIS — E1121 Type 2 diabetes mellitus with diabetic nephropathy: Secondary | ICD-10-CM | POA: Diagnosis not present

## 2016-08-08 DIAGNOSIS — E119 Type 2 diabetes mellitus without complications: Secondary | ICD-10-CM | POA: Diagnosis not present

## 2016-08-15 DIAGNOSIS — L609 Nail disorder, unspecified: Secondary | ICD-10-CM | POA: Diagnosis not present

## 2016-08-15 DIAGNOSIS — E1142 Type 2 diabetes mellitus with diabetic polyneuropathy: Secondary | ICD-10-CM | POA: Diagnosis not present

## 2016-08-15 DIAGNOSIS — B351 Tinea unguium: Secondary | ICD-10-CM | POA: Diagnosis not present

## 2016-09-09 DIAGNOSIS — E119 Type 2 diabetes mellitus without complications: Secondary | ICD-10-CM | POA: Diagnosis not present

## 2016-09-09 DIAGNOSIS — E785 Hyperlipidemia, unspecified: Secondary | ICD-10-CM | POA: Diagnosis not present

## 2016-09-09 DIAGNOSIS — Z79899 Other long term (current) drug therapy: Secondary | ICD-10-CM | POA: Diagnosis not present

## 2016-09-19 DIAGNOSIS — E119 Type 2 diabetes mellitus without complications: Secondary | ICD-10-CM | POA: Diagnosis not present

## 2016-09-19 DIAGNOSIS — Z79899 Other long term (current) drug therapy: Secondary | ICD-10-CM | POA: Diagnosis not present

## 2016-09-19 DIAGNOSIS — E785 Hyperlipidemia, unspecified: Secondary | ICD-10-CM | POA: Diagnosis not present

## 2016-10-02 DIAGNOSIS — B20 Human immunodeficiency virus [HIV] disease: Secondary | ICD-10-CM

## 2016-10-03 DIAGNOSIS — M961 Postlaminectomy syndrome, not elsewhere classified: Secondary | ICD-10-CM | POA: Diagnosis not present

## 2016-10-03 DIAGNOSIS — M5416 Radiculopathy, lumbar region: Secondary | ICD-10-CM | POA: Diagnosis not present

## 2016-10-03 DIAGNOSIS — Z5181 Encounter for therapeutic drug level monitoring: Secondary | ICD-10-CM | POA: Diagnosis not present

## 2016-10-03 DIAGNOSIS — E1142 Type 2 diabetes mellitus with diabetic polyneuropathy: Secondary | ICD-10-CM | POA: Diagnosis not present

## 2016-10-03 DIAGNOSIS — M5136 Other intervertebral disc degeneration, lumbar region: Secondary | ICD-10-CM | POA: Diagnosis not present

## 2016-10-03 DIAGNOSIS — G894 Chronic pain syndrome: Secondary | ICD-10-CM | POA: Diagnosis not present

## 2016-10-16 DIAGNOSIS — E1140 Type 2 diabetes mellitus with diabetic neuropathy, unspecified: Secondary | ICD-10-CM | POA: Diagnosis not present

## 2016-10-16 DIAGNOSIS — E119 Type 2 diabetes mellitus without complications: Secondary | ICD-10-CM | POA: Diagnosis not present

## 2016-12-02 DIAGNOSIS — M48062 Spinal stenosis, lumbar region with neurogenic claudication: Secondary | ICD-10-CM | POA: Diagnosis not present

## 2016-12-02 DIAGNOSIS — M961 Postlaminectomy syndrome, not elsewhere classified: Secondary | ICD-10-CM | POA: Diagnosis not present

## 2016-12-02 DIAGNOSIS — E1142 Type 2 diabetes mellitus with diabetic polyneuropathy: Secondary | ICD-10-CM | POA: Diagnosis not present

## 2017-01-31 DIAGNOSIS — E7800 Pure hypercholesterolemia, unspecified: Secondary | ICD-10-CM | POA: Diagnosis not present

## 2017-01-31 DIAGNOSIS — Z1389 Encounter for screening for other disorder: Secondary | ICD-10-CM | POA: Diagnosis not present

## 2017-01-31 DIAGNOSIS — Z87891 Personal history of nicotine dependence: Secondary | ICD-10-CM | POA: Diagnosis not present

## 2017-01-31 DIAGNOSIS — E119 Type 2 diabetes mellitus without complications: Secondary | ICD-10-CM | POA: Diagnosis not present

## 2017-01-31 DIAGNOSIS — E1140 Type 2 diabetes mellitus with diabetic neuropathy, unspecified: Secondary | ICD-10-CM | POA: Diagnosis not present

## 2017-01-31 DIAGNOSIS — Z0000 Encounter for general adult medical examination without abnormal findings: Secondary | ICD-10-CM | POA: Diagnosis not present

## 2017-02-03 DIAGNOSIS — M5136 Other intervertebral disc degeneration, lumbar region: Secondary | ICD-10-CM | POA: Diagnosis not present

## 2017-02-03 DIAGNOSIS — G894 Chronic pain syndrome: Secondary | ICD-10-CM | POA: Diagnosis not present

## 2017-02-03 DIAGNOSIS — M961 Postlaminectomy syndrome, not elsewhere classified: Secondary | ICD-10-CM | POA: Diagnosis not present

## 2017-02-03 DIAGNOSIS — M5416 Radiculopathy, lumbar region: Secondary | ICD-10-CM | POA: Diagnosis not present

## 2017-02-14 ENCOUNTER — Ambulatory Visit: Payer: Self-pay | Admitting: Sports Medicine

## 2017-03-05 DIAGNOSIS — Z86718 Personal history of other venous thrombosis and embolism: Secondary | ICD-10-CM | POA: Diagnosis not present

## 2017-03-05 DIAGNOSIS — I071 Rheumatic tricuspid insufficiency: Secondary | ICD-10-CM | POA: Diagnosis not present

## 2017-03-05 DIAGNOSIS — B351 Tinea unguium: Secondary | ICD-10-CM | POA: Diagnosis not present

## 2017-03-05 DIAGNOSIS — J449 Chronic obstructive pulmonary disease, unspecified: Secondary | ICD-10-CM | POA: Diagnosis not present

## 2017-03-05 DIAGNOSIS — Z79891 Long term (current) use of opiate analgesic: Secondary | ICD-10-CM | POA: Diagnosis not present

## 2017-03-05 DIAGNOSIS — E1140 Type 2 diabetes mellitus with diabetic neuropathy, unspecified: Secondary | ICD-10-CM | POA: Diagnosis not present

## 2017-03-05 DIAGNOSIS — G9340 Encephalopathy, unspecified: Secondary | ICD-10-CM | POA: Diagnosis not present

## 2017-03-05 DIAGNOSIS — J9690 Respiratory failure, unspecified, unspecified whether with hypoxia or hypercapnia: Secondary | ICD-10-CM | POA: Diagnosis not present

## 2017-03-05 DIAGNOSIS — A419 Sepsis, unspecified organism: Secondary | ICD-10-CM | POA: Diagnosis not present

## 2017-03-05 DIAGNOSIS — E872 Acidosis: Secondary | ICD-10-CM | POA: Diagnosis not present

## 2017-03-05 DIAGNOSIS — J9601 Acute respiratory failure with hypoxia: Secondary | ICD-10-CM | POA: Diagnosis not present

## 2017-03-05 DIAGNOSIS — J690 Pneumonitis due to inhalation of food and vomit: Secondary | ICD-10-CM | POA: Diagnosis not present

## 2017-03-05 DIAGNOSIS — Z833 Family history of diabetes mellitus: Secondary | ICD-10-CM | POA: Diagnosis not present

## 2017-03-05 DIAGNOSIS — R6521 Severe sepsis with septic shock: Secondary | ICD-10-CM | POA: Diagnosis not present

## 2017-03-05 DIAGNOSIS — E1142 Type 2 diabetes mellitus with diabetic polyneuropathy: Secondary | ICD-10-CM | POA: Diagnosis not present

## 2017-03-05 DIAGNOSIS — J168 Pneumonia due to other specified infectious organisms: Secondary | ICD-10-CM | POA: Diagnosis not present

## 2017-03-05 DIAGNOSIS — N179 Acute kidney failure, unspecified: Secondary | ICD-10-CM | POA: Diagnosis not present

## 2017-03-05 DIAGNOSIS — J9600 Acute respiratory failure, unspecified whether with hypoxia or hypercapnia: Secondary | ICD-10-CM | POA: Diagnosis not present

## 2017-03-05 DIAGNOSIS — E1121 Type 2 diabetes mellitus with diabetic nephropathy: Secondary | ICD-10-CM | POA: Diagnosis not present

## 2017-03-05 DIAGNOSIS — Z8249 Family history of ischemic heart disease and other diseases of the circulatory system: Secondary | ICD-10-CM | POA: Diagnosis not present

## 2017-03-05 DIAGNOSIS — R918 Other nonspecific abnormal finding of lung field: Secondary | ICD-10-CM | POA: Diagnosis not present

## 2017-03-05 DIAGNOSIS — E860 Dehydration: Secondary | ICD-10-CM | POA: Diagnosis not present

## 2017-03-05 DIAGNOSIS — R000 Tachycardia, unspecified: Secondary | ICD-10-CM | POA: Diagnosis not present

## 2017-03-05 DIAGNOSIS — R509 Fever, unspecified: Secondary | ICD-10-CM | POA: Diagnosis not present

## 2017-03-05 DIAGNOSIS — R031 Nonspecific low blood-pressure reading: Secondary | ICD-10-CM | POA: Diagnosis not present

## 2017-03-05 DIAGNOSIS — G894 Chronic pain syndrome: Secondary | ICD-10-CM | POA: Diagnosis not present

## 2017-03-05 DIAGNOSIS — D72829 Elevated white blood cell count, unspecified: Secondary | ICD-10-CM | POA: Diagnosis not present

## 2017-03-05 DIAGNOSIS — E871 Hypo-osmolality and hyponatremia: Secondary | ICD-10-CM | POA: Diagnosis not present

## 2017-03-05 DIAGNOSIS — G4733 Obstructive sleep apnea (adult) (pediatric): Secondary | ICD-10-CM | POA: Diagnosis not present

## 2017-03-05 DIAGNOSIS — I82513 Chronic embolism and thrombosis of femoral vein, bilateral: Secondary | ICD-10-CM | POA: Diagnosis not present

## 2017-03-05 DIAGNOSIS — E785 Hyperlipidemia, unspecified: Secondary | ICD-10-CM | POA: Diagnosis not present

## 2017-03-05 DIAGNOSIS — G8929 Other chronic pain: Secondary | ICD-10-CM | POA: Diagnosis not present

## 2017-03-07 DIAGNOSIS — J9690 Respiratory failure, unspecified, unspecified whether with hypoxia or hypercapnia: Secondary | ICD-10-CM | POA: Diagnosis not present

## 2017-03-13 DIAGNOSIS — J189 Pneumonia, unspecified organism: Secondary | ICD-10-CM | POA: Diagnosis not present

## 2017-03-18 DIAGNOSIS — M48062 Spinal stenosis, lumbar region with neurogenic claudication: Secondary | ICD-10-CM | POA: Diagnosis not present

## 2017-03-20 DIAGNOSIS — M961 Postlaminectomy syndrome, not elsewhere classified: Secondary | ICD-10-CM | POA: Diagnosis not present

## 2017-03-20 DIAGNOSIS — M48062 Spinal stenosis, lumbar region with neurogenic claudication: Secondary | ICD-10-CM | POA: Diagnosis not present

## 2017-03-20 DIAGNOSIS — E1142 Type 2 diabetes mellitus with diabetic polyneuropathy: Secondary | ICD-10-CM | POA: Diagnosis not present

## 2017-03-25 DIAGNOSIS — I10 Essential (primary) hypertension: Secondary | ICD-10-CM | POA: Diagnosis not present

## 2017-03-25 DIAGNOSIS — L03119 Cellulitis of unspecified part of limb: Secondary | ICD-10-CM | POA: Diagnosis not present

## 2017-03-25 DIAGNOSIS — Z8249 Family history of ischemic heart disease and other diseases of the circulatory system: Secondary | ICD-10-CM | POA: Diagnosis not present

## 2017-03-25 DIAGNOSIS — L03115 Cellulitis of right lower limb: Secondary | ICD-10-CM | POA: Diagnosis not present

## 2017-03-25 DIAGNOSIS — R0602 Shortness of breath: Secondary | ICD-10-CM | POA: Diagnosis not present

## 2017-03-25 DIAGNOSIS — G894 Chronic pain syndrome: Secondary | ICD-10-CM | POA: Diagnosis not present

## 2017-03-25 DIAGNOSIS — E119 Type 2 diabetes mellitus without complications: Secondary | ICD-10-CM | POA: Diagnosis not present

## 2017-03-25 DIAGNOSIS — M7989 Other specified soft tissue disorders: Secondary | ICD-10-CM | POA: Diagnosis not present

## 2017-03-25 DIAGNOSIS — J449 Chronic obstructive pulmonary disease, unspecified: Secondary | ICD-10-CM | POA: Diagnosis not present

## 2017-03-25 DIAGNOSIS — R600 Localized edema: Secondary | ICD-10-CM | POA: Diagnosis not present

## 2017-03-25 DIAGNOSIS — R0789 Other chest pain: Secondary | ICD-10-CM | POA: Diagnosis not present

## 2017-03-25 DIAGNOSIS — G4733 Obstructive sleep apnea (adult) (pediatric): Secondary | ICD-10-CM | POA: Diagnosis not present

## 2017-03-25 DIAGNOSIS — D72829 Elevated white blood cell count, unspecified: Secondary | ICD-10-CM | POA: Diagnosis not present

## 2017-03-25 DIAGNOSIS — Z809 Family history of malignant neoplasm, unspecified: Secondary | ICD-10-CM | POA: Diagnosis not present

## 2017-03-25 DIAGNOSIS — Z880 Allergy status to penicillin: Secondary | ICD-10-CM | POA: Diagnosis not present

## 2017-03-25 DIAGNOSIS — M6282 Rhabdomyolysis: Secondary | ICD-10-CM | POA: Diagnosis not present

## 2017-03-25 DIAGNOSIS — Z7901 Long term (current) use of anticoagulants: Secondary | ICD-10-CM | POA: Diagnosis not present

## 2017-03-25 DIAGNOSIS — R609 Edema, unspecified: Secondary | ICD-10-CM | POA: Diagnosis not present

## 2017-03-25 DIAGNOSIS — Z7984 Long term (current) use of oral hypoglycemic drugs: Secondary | ICD-10-CM | POA: Diagnosis not present

## 2017-03-25 DIAGNOSIS — E1121 Type 2 diabetes mellitus with diabetic nephropathy: Secondary | ICD-10-CM | POA: Diagnosis not present

## 2017-03-25 DIAGNOSIS — Z833 Family history of diabetes mellitus: Secondary | ICD-10-CM | POA: Diagnosis not present

## 2017-03-25 DIAGNOSIS — L03116 Cellulitis of left lower limb: Secondary | ICD-10-CM | POA: Diagnosis not present

## 2017-03-25 DIAGNOSIS — Z86718 Personal history of other venous thrombosis and embolism: Secondary | ICD-10-CM | POA: Diagnosis not present

## 2017-03-25 DIAGNOSIS — E785 Hyperlipidemia, unspecified: Secondary | ICD-10-CM | POA: Diagnosis not present

## 2017-03-25 DIAGNOSIS — R404 Transient alteration of awareness: Secondary | ICD-10-CM | POA: Diagnosis not present

## 2017-03-25 DIAGNOSIS — R531 Weakness: Secondary | ICD-10-CM | POA: Diagnosis not present

## 2017-03-25 DIAGNOSIS — R079 Chest pain, unspecified: Secondary | ICD-10-CM | POA: Diagnosis not present

## 2017-03-25 DIAGNOSIS — N179 Acute kidney failure, unspecified: Secondary | ICD-10-CM | POA: Diagnosis not present

## 2017-03-26 DIAGNOSIS — Z833 Family history of diabetes mellitus: Secondary | ICD-10-CM | POA: Diagnosis not present

## 2017-03-26 DIAGNOSIS — N179 Acute kidney failure, unspecified: Secondary | ICD-10-CM | POA: Diagnosis not present

## 2017-03-26 DIAGNOSIS — R0789 Other chest pain: Secondary | ICD-10-CM | POA: Diagnosis not present

## 2017-03-26 DIAGNOSIS — I10 Essential (primary) hypertension: Secondary | ICD-10-CM | POA: Diagnosis not present

## 2017-03-26 DIAGNOSIS — Z809 Family history of malignant neoplasm, unspecified: Secondary | ICD-10-CM | POA: Diagnosis not present

## 2017-03-26 DIAGNOSIS — M6282 Rhabdomyolysis: Secondary | ICD-10-CM | POA: Diagnosis not present

## 2017-03-26 DIAGNOSIS — G894 Chronic pain syndrome: Secondary | ICD-10-CM | POA: Diagnosis not present

## 2017-03-26 DIAGNOSIS — J449 Chronic obstructive pulmonary disease, unspecified: Secondary | ICD-10-CM | POA: Diagnosis not present

## 2017-03-26 DIAGNOSIS — G4733 Obstructive sleep apnea (adult) (pediatric): Secondary | ICD-10-CM | POA: Diagnosis not present

## 2017-03-27 DIAGNOSIS — G894 Chronic pain syndrome: Secondary | ICD-10-CM | POA: Diagnosis not present

## 2017-03-27 DIAGNOSIS — N179 Acute kidney failure, unspecified: Secondary | ICD-10-CM | POA: Diagnosis not present

## 2017-03-27 DIAGNOSIS — G4733 Obstructive sleep apnea (adult) (pediatric): Secondary | ICD-10-CM | POA: Diagnosis not present

## 2017-03-27 DIAGNOSIS — J449 Chronic obstructive pulmonary disease, unspecified: Secondary | ICD-10-CM | POA: Diagnosis not present

## 2017-03-27 DIAGNOSIS — I10 Essential (primary) hypertension: Secondary | ICD-10-CM | POA: Diagnosis not present

## 2017-03-27 DIAGNOSIS — R0789 Other chest pain: Secondary | ICD-10-CM | POA: Diagnosis not present

## 2017-03-27 DIAGNOSIS — Z809 Family history of malignant neoplasm, unspecified: Secondary | ICD-10-CM | POA: Diagnosis not present

## 2017-03-27 DIAGNOSIS — M6282 Rhabdomyolysis: Secondary | ICD-10-CM | POA: Diagnosis not present

## 2017-03-27 DIAGNOSIS — Z833 Family history of diabetes mellitus: Secondary | ICD-10-CM | POA: Diagnosis not present

## 2017-03-30 DIAGNOSIS — B999 Unspecified infectious disease: Secondary | ICD-10-CM | POA: Diagnosis not present

## 2017-03-30 DIAGNOSIS — S80812A Abrasion, left lower leg, initial encounter: Secondary | ICD-10-CM | POA: Diagnosis not present

## 2017-03-30 DIAGNOSIS — R05 Cough: Secondary | ICD-10-CM | POA: Diagnosis not present

## 2017-03-30 DIAGNOSIS — D649 Anemia, unspecified: Secondary | ICD-10-CM | POA: Diagnosis not present

## 2017-03-30 DIAGNOSIS — S80811A Abrasion, right lower leg, initial encounter: Secondary | ICD-10-CM | POA: Diagnosis not present

## 2017-03-30 DIAGNOSIS — R51 Headache: Secondary | ICD-10-CM | POA: Diagnosis not present

## 2017-03-30 DIAGNOSIS — S40811A Abrasion of right upper arm, initial encounter: Secondary | ICD-10-CM | POA: Diagnosis not present

## 2017-03-30 DIAGNOSIS — S40812A Abrasion of left upper arm, initial encounter: Secondary | ICD-10-CM | POA: Diagnosis not present

## 2017-04-09 DIAGNOSIS — E1142 Type 2 diabetes mellitus with diabetic polyneuropathy: Secondary | ICD-10-CM | POA: Diagnosis not present

## 2017-04-09 DIAGNOSIS — M961 Postlaminectomy syndrome, not elsewhere classified: Secondary | ICD-10-CM | POA: Diagnosis not present

## 2017-04-09 DIAGNOSIS — M5136 Other intervertebral disc degeneration, lumbar region: Secondary | ICD-10-CM | POA: Diagnosis not present

## 2017-04-16 DIAGNOSIS — Z23 Encounter for immunization: Secondary | ICD-10-CM | POA: Diagnosis not present

## 2017-04-17 DIAGNOSIS — Z79899 Other long term (current) drug therapy: Secondary | ICD-10-CM | POA: Diagnosis not present

## 2017-04-23 DIAGNOSIS — M5136 Other intervertebral disc degeneration, lumbar region: Secondary | ICD-10-CM | POA: Diagnosis not present

## 2017-04-23 DIAGNOSIS — E1142 Type 2 diabetes mellitus with diabetic polyneuropathy: Secondary | ICD-10-CM | POA: Diagnosis not present

## 2017-04-23 DIAGNOSIS — G894 Chronic pain syndrome: Secondary | ICD-10-CM | POA: Diagnosis not present

## 2017-05-26 DIAGNOSIS — R609 Edema, unspecified: Secondary | ICD-10-CM | POA: Diagnosis not present

## 2017-05-26 DIAGNOSIS — M79606 Pain in leg, unspecified: Secondary | ICD-10-CM | POA: Diagnosis not present

## 2017-05-26 DIAGNOSIS — E1165 Type 2 diabetes mellitus with hyperglycemia: Secondary | ICD-10-CM | POA: Diagnosis not present

## 2017-06-18 DIAGNOSIS — M5136 Other intervertebral disc degeneration, lumbar region: Secondary | ICD-10-CM | POA: Diagnosis not present

## 2017-06-18 DIAGNOSIS — M48062 Spinal stenosis, lumbar region with neurogenic claudication: Secondary | ICD-10-CM | POA: Diagnosis not present

## 2017-06-18 DIAGNOSIS — E1142 Type 2 diabetes mellitus with diabetic polyneuropathy: Secondary | ICD-10-CM | POA: Diagnosis not present

## 2017-06-18 DIAGNOSIS — M961 Postlaminectomy syndrome, not elsewhere classified: Secondary | ICD-10-CM | POA: Diagnosis not present

## 2017-08-12 DIAGNOSIS — G894 Chronic pain syndrome: Secondary | ICD-10-CM | POA: Diagnosis not present

## 2017-08-12 DIAGNOSIS — M5136 Other intervertebral disc degeneration, lumbar region: Secondary | ICD-10-CM | POA: Diagnosis not present

## 2017-08-12 DIAGNOSIS — M961 Postlaminectomy syndrome, not elsewhere classified: Secondary | ICD-10-CM | POA: Diagnosis not present

## 2017-08-12 DIAGNOSIS — M5416 Radiculopathy, lumbar region: Secondary | ICD-10-CM | POA: Diagnosis not present

## 2017-08-12 DIAGNOSIS — E1142 Type 2 diabetes mellitus with diabetic polyneuropathy: Secondary | ICD-10-CM | POA: Diagnosis not present

## 2017-08-25 DIAGNOSIS — G4700 Insomnia, unspecified: Secondary | ICD-10-CM | POA: Diagnosis not present

## 2017-08-25 DIAGNOSIS — E1165 Type 2 diabetes mellitus with hyperglycemia: Secondary | ICD-10-CM | POA: Diagnosis not present

## 2017-10-01 DIAGNOSIS — E1165 Type 2 diabetes mellitus with hyperglycemia: Secondary | ICD-10-CM | POA: Diagnosis not present

## 2017-10-07 DIAGNOSIS — Z79899 Other long term (current) drug therapy: Secondary | ICD-10-CM | POA: Diagnosis not present

## 2017-10-09 DIAGNOSIS — M961 Postlaminectomy syndrome, not elsewhere classified: Secondary | ICD-10-CM | POA: Diagnosis not present

## 2017-10-09 DIAGNOSIS — E1142 Type 2 diabetes mellitus with diabetic polyneuropathy: Secondary | ICD-10-CM | POA: Diagnosis not present

## 2017-10-09 DIAGNOSIS — M5136 Other intervertebral disc degeneration, lumbar region: Secondary | ICD-10-CM | POA: Diagnosis not present

## 2017-12-05 DIAGNOSIS — E1165 Type 2 diabetes mellitus with hyperglycemia: Secondary | ICD-10-CM | POA: Diagnosis not present

## 2017-12-18 DIAGNOSIS — E785 Hyperlipidemia, unspecified: Secondary | ICD-10-CM | POA: Diagnosis not present

## 2017-12-18 DIAGNOSIS — Z23 Encounter for immunization: Secondary | ICD-10-CM | POA: Diagnosis not present

## 2017-12-18 DIAGNOSIS — Z79899 Other long term (current) drug therapy: Secondary | ICD-10-CM | POA: Diagnosis not present

## 2017-12-18 DIAGNOSIS — I10 Essential (primary) hypertension: Secondary | ICD-10-CM | POA: Diagnosis not present

## 2017-12-18 DIAGNOSIS — J449 Chronic obstructive pulmonary disease, unspecified: Secondary | ICD-10-CM | POA: Diagnosis not present

## 2018-01-07 DIAGNOSIS — M961 Postlaminectomy syndrome, not elsewhere classified: Secondary | ICD-10-CM | POA: Diagnosis not present

## 2018-01-07 DIAGNOSIS — M48062 Spinal stenosis, lumbar region with neurogenic claudication: Secondary | ICD-10-CM | POA: Diagnosis not present

## 2018-01-07 DIAGNOSIS — E1142 Type 2 diabetes mellitus with diabetic polyneuropathy: Secondary | ICD-10-CM | POA: Diagnosis not present

## 2018-03-06 DIAGNOSIS — E1165 Type 2 diabetes mellitus with hyperglycemia: Secondary | ICD-10-CM | POA: Diagnosis not present

## 2018-03-06 DIAGNOSIS — E1140 Type 2 diabetes mellitus with diabetic neuropathy, unspecified: Secondary | ICD-10-CM | POA: Diagnosis not present

## 2018-03-06 DIAGNOSIS — Z1389 Encounter for screening for other disorder: Secondary | ICD-10-CM | POA: Diagnosis not present

## 2018-03-06 DIAGNOSIS — Z23 Encounter for immunization: Secondary | ICD-10-CM | POA: Diagnosis not present

## 2018-03-06 DIAGNOSIS — Z87891 Personal history of nicotine dependence: Secondary | ICD-10-CM | POA: Diagnosis not present

## 2018-03-06 DIAGNOSIS — E7800 Pure hypercholesterolemia, unspecified: Secondary | ICD-10-CM | POA: Diagnosis not present

## 2018-03-06 DIAGNOSIS — R809 Proteinuria, unspecified: Secondary | ICD-10-CM | POA: Diagnosis not present

## 2018-03-06 DIAGNOSIS — Z0000 Encounter for general adult medical examination without abnormal findings: Secondary | ICD-10-CM | POA: Diagnosis not present

## 2018-04-07 DIAGNOSIS — M961 Postlaminectomy syndrome, not elsewhere classified: Secondary | ICD-10-CM | POA: Diagnosis not present

## 2018-04-07 DIAGNOSIS — M5136 Other intervertebral disc degeneration, lumbar region: Secondary | ICD-10-CM | POA: Diagnosis not present

## 2018-04-07 DIAGNOSIS — E1142 Type 2 diabetes mellitus with diabetic polyneuropathy: Secondary | ICD-10-CM | POA: Diagnosis not present

## 2018-04-16 DIAGNOSIS — Z79899 Other long term (current) drug therapy: Secondary | ICD-10-CM | POA: Diagnosis not present

## 2018-04-16 DIAGNOSIS — I129 Hypertensive chronic kidney disease with stage 1 through stage 4 chronic kidney disease, or unspecified chronic kidney disease: Secondary | ICD-10-CM | POA: Diagnosis not present

## 2018-04-16 DIAGNOSIS — E1122 Type 2 diabetes mellitus with diabetic chronic kidney disease: Secondary | ICD-10-CM | POA: Diagnosis not present

## 2018-04-16 DIAGNOSIS — E785 Hyperlipidemia, unspecified: Secondary | ICD-10-CM | POA: Diagnosis not present

## 2018-04-16 DIAGNOSIS — N189 Chronic kidney disease, unspecified: Secondary | ICD-10-CM | POA: Diagnosis not present

## 2018-04-16 DIAGNOSIS — G894 Chronic pain syndrome: Secondary | ICD-10-CM | POA: Diagnosis not present

## 2018-04-16 DIAGNOSIS — J449 Chronic obstructive pulmonary disease, unspecified: Secondary | ICD-10-CM | POA: Diagnosis not present

## 2018-04-16 DIAGNOSIS — Z86718 Personal history of other venous thrombosis and embolism: Secondary | ICD-10-CM | POA: Diagnosis not present

## 2018-04-23 DIAGNOSIS — E785 Hyperlipidemia, unspecified: Secondary | ICD-10-CM | POA: Diagnosis not present

## 2018-04-23 DIAGNOSIS — Z79899 Other long term (current) drug therapy: Secondary | ICD-10-CM | POA: Diagnosis not present

## 2018-04-23 DIAGNOSIS — G894 Chronic pain syndrome: Secondary | ICD-10-CM | POA: Diagnosis not present

## 2018-04-23 DIAGNOSIS — Z86718 Personal history of other venous thrombosis and embolism: Secondary | ICD-10-CM | POA: Diagnosis not present

## 2018-04-23 DIAGNOSIS — I129 Hypertensive chronic kidney disease with stage 1 through stage 4 chronic kidney disease, or unspecified chronic kidney disease: Secondary | ICD-10-CM | POA: Diagnosis not present

## 2018-04-23 DIAGNOSIS — E1122 Type 2 diabetes mellitus with diabetic chronic kidney disease: Secondary | ICD-10-CM | POA: Diagnosis not present

## 2018-04-23 DIAGNOSIS — J449 Chronic obstructive pulmonary disease, unspecified: Secondary | ICD-10-CM | POA: Diagnosis not present

## 2018-04-23 DIAGNOSIS — N189 Chronic kidney disease, unspecified: Secondary | ICD-10-CM | POA: Diagnosis not present

## 2018-07-06 DIAGNOSIS — M5136 Other intervertebral disc degeneration, lumbar region: Secondary | ICD-10-CM | POA: Diagnosis not present

## 2018-07-06 DIAGNOSIS — E1142 Type 2 diabetes mellitus with diabetic polyneuropathy: Secondary | ICD-10-CM | POA: Diagnosis not present

## 2018-07-06 DIAGNOSIS — M961 Postlaminectomy syndrome, not elsewhere classified: Secondary | ICD-10-CM | POA: Diagnosis not present

## 2018-09-30 DIAGNOSIS — M48062 Spinal stenosis, lumbar region with neurogenic claudication: Secondary | ICD-10-CM | POA: Diagnosis not present

## 2018-09-30 DIAGNOSIS — E1142 Type 2 diabetes mellitus with diabetic polyneuropathy: Secondary | ICD-10-CM | POA: Diagnosis not present

## 2018-09-30 DIAGNOSIS — M961 Postlaminectomy syndrome, not elsewhere classified: Secondary | ICD-10-CM | POA: Diagnosis not present

## 2018-10-11 DIAGNOSIS — I10 Essential (primary) hypertension: Secondary | ICD-10-CM | POA: Diagnosis not present

## 2018-10-11 DIAGNOSIS — E1149 Type 2 diabetes mellitus with other diabetic neurological complication: Secondary | ICD-10-CM | POA: Diagnosis not present

## 2018-10-12 DIAGNOSIS — E1165 Type 2 diabetes mellitus with hyperglycemia: Secondary | ICD-10-CM | POA: Diagnosis not present

## 2018-10-12 DIAGNOSIS — E1140 Type 2 diabetes mellitus with diabetic neuropathy, unspecified: Secondary | ICD-10-CM | POA: Diagnosis not present

## 2018-10-16 DIAGNOSIS — Z79899 Other long term (current) drug therapy: Secondary | ICD-10-CM | POA: Diagnosis not present

## 2018-11-09 DIAGNOSIS — R112 Nausea with vomiting, unspecified: Secondary | ICD-10-CM | POA: Diagnosis not present

## 2018-11-19 DIAGNOSIS — K219 Gastro-esophageal reflux disease without esophagitis: Secondary | ICD-10-CM | POA: Diagnosis not present

## 2018-12-30 DIAGNOSIS — M48062 Spinal stenosis, lumbar region with neurogenic claudication: Secondary | ICD-10-CM | POA: Diagnosis not present

## 2018-12-30 DIAGNOSIS — E1142 Type 2 diabetes mellitus with diabetic polyneuropathy: Secondary | ICD-10-CM | POA: Diagnosis not present

## 2018-12-30 DIAGNOSIS — M5136 Other intervertebral disc degeneration, lumbar region: Secondary | ICD-10-CM | POA: Diagnosis not present

## 2018-12-31 ENCOUNTER — Encounter: Payer: Self-pay | Admitting: Gastroenterology

## 2018-12-31 ENCOUNTER — Encounter: Payer: Self-pay | Admitting: Internal Medicine

## 2018-12-31 DIAGNOSIS — B20 Human immunodeficiency virus [HIV] disease: Secondary | ICD-10-CM | POA: Diagnosis not present

## 2018-12-31 DIAGNOSIS — Z79899 Other long term (current) drug therapy: Secondary | ICD-10-CM | POA: Diagnosis not present

## 2019-01-18 DIAGNOSIS — E119 Type 2 diabetes mellitus without complications: Secondary | ICD-10-CM | POA: Diagnosis not present

## 2019-01-28 DIAGNOSIS — S91111A Laceration without foreign body of right great toe without damage to nail, initial encounter: Secondary | ICD-10-CM | POA: Diagnosis not present

## 2019-02-08 DIAGNOSIS — I96 Gangrene, not elsewhere classified: Secondary | ICD-10-CM | POA: Diagnosis not present

## 2019-02-08 DIAGNOSIS — L089 Local infection of the skin and subcutaneous tissue, unspecified: Secondary | ICD-10-CM | POA: Diagnosis not present

## 2019-02-08 DIAGNOSIS — Z7984 Long term (current) use of oral hypoglycemic drugs: Secondary | ICD-10-CM | POA: Diagnosis not present

## 2019-02-08 DIAGNOSIS — Z880 Allergy status to penicillin: Secondary | ICD-10-CM | POA: Diagnosis not present

## 2019-02-08 DIAGNOSIS — E1140 Type 2 diabetes mellitus with diabetic neuropathy, unspecified: Secondary | ICD-10-CM | POA: Diagnosis not present

## 2019-02-08 DIAGNOSIS — L0889 Other specified local infections of the skin and subcutaneous tissue: Secondary | ICD-10-CM | POA: Diagnosis not present

## 2019-02-08 DIAGNOSIS — M868X7 Other osteomyelitis, ankle and foot: Secondary | ICD-10-CM | POA: Diagnosis not present

## 2019-02-08 DIAGNOSIS — L97519 Non-pressure chronic ulcer of other part of right foot with unspecified severity: Secondary | ICD-10-CM | POA: Diagnosis not present

## 2019-02-08 DIAGNOSIS — G4730 Sleep apnea, unspecified: Secondary | ICD-10-CM | POA: Diagnosis not present

## 2019-02-08 DIAGNOSIS — I10 Essential (primary) hypertension: Secondary | ICD-10-CM | POA: Diagnosis not present

## 2019-02-08 DIAGNOSIS — E1169 Type 2 diabetes mellitus with other specified complication: Secondary | ICD-10-CM | POA: Diagnosis not present

## 2019-02-08 DIAGNOSIS — A419 Sepsis, unspecified organism: Secondary | ICD-10-CM | POA: Diagnosis not present

## 2019-02-08 DIAGNOSIS — J449 Chronic obstructive pulmonary disease, unspecified: Secondary | ICD-10-CM | POA: Diagnosis not present

## 2019-02-08 DIAGNOSIS — Z89411 Acquired absence of right great toe: Secondary | ICD-10-CM | POA: Diagnosis not present

## 2019-02-08 DIAGNOSIS — E119 Type 2 diabetes mellitus without complications: Secondary | ICD-10-CM | POA: Diagnosis not present

## 2019-02-08 DIAGNOSIS — L97514 Non-pressure chronic ulcer of other part of right foot with necrosis of bone: Secondary | ICD-10-CM | POA: Diagnosis not present

## 2019-02-08 DIAGNOSIS — L97518 Non-pressure chronic ulcer of other part of right foot with other specified severity: Secondary | ICD-10-CM | POA: Diagnosis not present

## 2019-02-08 DIAGNOSIS — S91101A Unspecified open wound of right great toe without damage to nail, initial encounter: Secondary | ICD-10-CM | POA: Diagnosis not present

## 2019-02-08 DIAGNOSIS — L609 Nail disorder, unspecified: Secondary | ICD-10-CM | POA: Diagnosis not present

## 2019-02-08 DIAGNOSIS — I129 Hypertensive chronic kidney disease with stage 1 through stage 4 chronic kidney disease, or unspecified chronic kidney disease: Secondary | ICD-10-CM | POA: Diagnosis not present

## 2019-02-08 DIAGNOSIS — Z86718 Personal history of other venous thrombosis and embolism: Secondary | ICD-10-CM | POA: Diagnosis not present

## 2019-02-08 DIAGNOSIS — Z833 Family history of diabetes mellitus: Secondary | ICD-10-CM | POA: Diagnosis not present

## 2019-02-08 DIAGNOSIS — N189 Chronic kidney disease, unspecified: Secondary | ICD-10-CM | POA: Diagnosis not present

## 2019-02-08 DIAGNOSIS — E11628 Type 2 diabetes mellitus with other skin complications: Secondary | ICD-10-CM | POA: Diagnosis not present

## 2019-02-08 DIAGNOSIS — K219 Gastro-esophageal reflux disease without esophagitis: Secondary | ICD-10-CM | POA: Diagnosis not present

## 2019-02-08 DIAGNOSIS — E785 Hyperlipidemia, unspecified: Secondary | ICD-10-CM | POA: Diagnosis not present

## 2019-02-08 DIAGNOSIS — E1122 Type 2 diabetes mellitus with diabetic chronic kidney disease: Secondary | ICD-10-CM | POA: Diagnosis not present

## 2019-02-08 DIAGNOSIS — M7989 Other specified soft tissue disorders: Secondary | ICD-10-CM | POA: Diagnosis not present

## 2019-02-08 DIAGNOSIS — I82409 Acute embolism and thrombosis of unspecified deep veins of unspecified lower extremity: Secondary | ICD-10-CM | POA: Diagnosis not present

## 2019-02-08 DIAGNOSIS — E1152 Type 2 diabetes mellitus with diabetic peripheral angiopathy with gangrene: Secondary | ICD-10-CM | POA: Diagnosis not present

## 2019-02-08 DIAGNOSIS — E11621 Type 2 diabetes mellitus with foot ulcer: Secondary | ICD-10-CM | POA: Diagnosis not present

## 2019-02-08 DIAGNOSIS — L97509 Non-pressure chronic ulcer of other part of unspecified foot with unspecified severity: Secondary | ICD-10-CM | POA: Diagnosis not present

## 2019-02-08 DIAGNOSIS — Z7901 Long term (current) use of anticoagulants: Secondary | ICD-10-CM | POA: Diagnosis not present

## 2019-02-09 DIAGNOSIS — E11628 Type 2 diabetes mellitus with other skin complications: Secondary | ICD-10-CM | POA: Diagnosis not present

## 2019-02-09 DIAGNOSIS — L97519 Non-pressure chronic ulcer of other part of right foot with unspecified severity: Secondary | ICD-10-CM | POA: Diagnosis not present

## 2019-02-09 DIAGNOSIS — Z86718 Personal history of other venous thrombosis and embolism: Secondary | ICD-10-CM | POA: Diagnosis not present

## 2019-02-09 DIAGNOSIS — E11621 Type 2 diabetes mellitus with foot ulcer: Secondary | ICD-10-CM | POA: Diagnosis not present

## 2019-02-10 DIAGNOSIS — E11621 Type 2 diabetes mellitus with foot ulcer: Secondary | ICD-10-CM | POA: Diagnosis not present

## 2019-02-10 DIAGNOSIS — I82409 Acute embolism and thrombosis of unspecified deep veins of unspecified lower extremity: Secondary | ICD-10-CM | POA: Diagnosis not present

## 2019-02-10 DIAGNOSIS — I96 Gangrene, not elsewhere classified: Secondary | ICD-10-CM | POA: Diagnosis not present

## 2019-02-10 DIAGNOSIS — E1140 Type 2 diabetes mellitus with diabetic neuropathy, unspecified: Secondary | ICD-10-CM | POA: Diagnosis not present

## 2019-02-10 DIAGNOSIS — L97514 Non-pressure chronic ulcer of other part of right foot with necrosis of bone: Secondary | ICD-10-CM | POA: Diagnosis not present

## 2019-02-10 DIAGNOSIS — E1152 Type 2 diabetes mellitus with diabetic peripheral angiopathy with gangrene: Secondary | ICD-10-CM | POA: Diagnosis not present

## 2019-02-10 DIAGNOSIS — L97519 Non-pressure chronic ulcer of other part of right foot with unspecified severity: Secondary | ICD-10-CM | POA: Diagnosis not present

## 2019-02-10 DIAGNOSIS — E11628 Type 2 diabetes mellitus with other skin complications: Secondary | ICD-10-CM | POA: Diagnosis not present

## 2019-02-10 DIAGNOSIS — M868X7 Other osteomyelitis, ankle and foot: Secondary | ICD-10-CM | POA: Diagnosis not present

## 2019-02-11 DIAGNOSIS — Z89411 Acquired absence of right great toe: Secondary | ICD-10-CM | POA: Diagnosis not present

## 2019-02-11 DIAGNOSIS — L97519 Non-pressure chronic ulcer of other part of right foot with unspecified severity: Secondary | ICD-10-CM | POA: Diagnosis not present

## 2019-02-11 DIAGNOSIS — E1140 Type 2 diabetes mellitus with diabetic neuropathy, unspecified: Secondary | ICD-10-CM | POA: Diagnosis not present

## 2019-02-11 DIAGNOSIS — E11621 Type 2 diabetes mellitus with foot ulcer: Secondary | ICD-10-CM | POA: Diagnosis not present

## 2019-02-11 DIAGNOSIS — M7989 Other specified soft tissue disorders: Secondary | ICD-10-CM | POA: Diagnosis not present

## 2019-02-11 DIAGNOSIS — L089 Local infection of the skin and subcutaneous tissue, unspecified: Secondary | ICD-10-CM | POA: Diagnosis not present

## 2019-02-11 DIAGNOSIS — E11628 Type 2 diabetes mellitus with other skin complications: Secondary | ICD-10-CM | POA: Diagnosis not present

## 2019-02-11 DIAGNOSIS — I82409 Acute embolism and thrombosis of unspecified deep veins of unspecified lower extremity: Secondary | ICD-10-CM | POA: Diagnosis not present

## 2019-02-12 DIAGNOSIS — E11628 Type 2 diabetes mellitus with other skin complications: Secondary | ICD-10-CM | POA: Diagnosis not present

## 2019-02-12 DIAGNOSIS — E1140 Type 2 diabetes mellitus with diabetic neuropathy, unspecified: Secondary | ICD-10-CM | POA: Diagnosis not present

## 2019-02-12 DIAGNOSIS — L089 Local infection of the skin and subcutaneous tissue, unspecified: Secondary | ICD-10-CM | POA: Diagnosis not present

## 2019-02-12 DIAGNOSIS — I96 Gangrene, not elsewhere classified: Secondary | ICD-10-CM | POA: Diagnosis not present

## 2019-02-12 DIAGNOSIS — E119 Type 2 diabetes mellitus without complications: Secondary | ICD-10-CM | POA: Diagnosis not present

## 2019-02-17 DIAGNOSIS — Z23 Encounter for immunization: Secondary | ICD-10-CM | POA: Diagnosis not present

## 2019-02-17 DIAGNOSIS — L97509 Non-pressure chronic ulcer of other part of unspecified foot with unspecified severity: Secondary | ICD-10-CM | POA: Diagnosis not present

## 2019-02-17 DIAGNOSIS — E11621 Type 2 diabetes mellitus with foot ulcer: Secondary | ICD-10-CM | POA: Diagnosis not present

## 2019-03-12 DIAGNOSIS — E785 Hyperlipidemia, unspecified: Secondary | ICD-10-CM | POA: Diagnosis not present

## 2019-03-12 DIAGNOSIS — R404 Transient alteration of awareness: Secondary | ICD-10-CM | POA: Diagnosis not present

## 2019-03-12 DIAGNOSIS — Z86718 Personal history of other venous thrombosis and embolism: Secondary | ICD-10-CM | POA: Diagnosis not present

## 2019-03-12 DIAGNOSIS — E11649 Type 2 diabetes mellitus with hypoglycemia without coma: Secondary | ICD-10-CM | POA: Diagnosis not present

## 2019-03-12 DIAGNOSIS — E161 Other hypoglycemia: Secondary | ICD-10-CM | POA: Diagnosis not present

## 2019-03-12 DIAGNOSIS — G20 Parkinson's disease: Secondary | ICD-10-CM | POA: Diagnosis not present

## 2019-03-12 DIAGNOSIS — I10 Essential (primary) hypertension: Secondary | ICD-10-CM | POA: Diagnosis not present

## 2019-03-12 DIAGNOSIS — Z7901 Long term (current) use of anticoagulants: Secondary | ICD-10-CM | POA: Diagnosis not present

## 2019-03-12 DIAGNOSIS — E10649 Type 1 diabetes mellitus with hypoglycemia without coma: Secondary | ICD-10-CM | POA: Diagnosis not present

## 2019-03-12 DIAGNOSIS — E1142 Type 2 diabetes mellitus with diabetic polyneuropathy: Secondary | ICD-10-CM | POA: Diagnosis not present

## 2019-03-12 DIAGNOSIS — Z79899 Other long term (current) drug therapy: Secondary | ICD-10-CM | POA: Diagnosis not present

## 2019-03-12 DIAGNOSIS — E162 Hypoglycemia, unspecified: Secondary | ICD-10-CM | POA: Diagnosis not present

## 2019-03-12 DIAGNOSIS — Z794 Long term (current) use of insulin: Secondary | ICD-10-CM | POA: Diagnosis not present

## 2019-03-12 DIAGNOSIS — R410 Disorientation, unspecified: Secondary | ICD-10-CM | POA: Diagnosis not present

## 2019-03-13 DIAGNOSIS — I10 Essential (primary) hypertension: Secondary | ICD-10-CM | POA: Diagnosis not present

## 2019-03-13 DIAGNOSIS — E162 Hypoglycemia, unspecified: Secondary | ICD-10-CM | POA: Diagnosis not present

## 2019-03-17 DIAGNOSIS — E119 Type 2 diabetes mellitus without complications: Secondary | ICD-10-CM | POA: Diagnosis not present

## 2019-03-17 DIAGNOSIS — E162 Hypoglycemia, unspecified: Secondary | ICD-10-CM | POA: Diagnosis not present

## 2019-03-30 DIAGNOSIS — E1142 Type 2 diabetes mellitus with diabetic polyneuropathy: Secondary | ICD-10-CM | POA: Diagnosis not present

## 2019-03-30 DIAGNOSIS — M5136 Other intervertebral disc degeneration, lumbar region: Secondary | ICD-10-CM | POA: Diagnosis not present

## 2019-03-30 DIAGNOSIS — M961 Postlaminectomy syndrome, not elsewhere classified: Secondary | ICD-10-CM | POA: Diagnosis not present

## 2019-04-15 DIAGNOSIS — M79604 Pain in right leg: Secondary | ICD-10-CM | POA: Diagnosis not present

## 2019-04-15 DIAGNOSIS — J449 Chronic obstructive pulmonary disease, unspecified: Secondary | ICD-10-CM | POA: Diagnosis not present

## 2019-04-15 DIAGNOSIS — R0602 Shortness of breath: Secondary | ICD-10-CM | POA: Diagnosis not present

## 2019-04-15 DIAGNOSIS — I70203 Unspecified atherosclerosis of native arteries of extremities, bilateral legs: Secondary | ICD-10-CM | POA: Diagnosis not present

## 2019-04-15 DIAGNOSIS — M79661 Pain in right lower leg: Secondary | ICD-10-CM | POA: Diagnosis not present

## 2019-04-15 DIAGNOSIS — Z86718 Personal history of other venous thrombosis and embolism: Secondary | ICD-10-CM | POA: Diagnosis not present

## 2019-04-21 DIAGNOSIS — Z720 Tobacco use: Secondary | ICD-10-CM | POA: Diagnosis not present

## 2019-04-21 DIAGNOSIS — I82513 Chronic embolism and thrombosis of femoral vein, bilateral: Secondary | ICD-10-CM | POA: Diagnosis not present

## 2019-04-21 DIAGNOSIS — E119 Type 2 diabetes mellitus without complications: Secondary | ICD-10-CM | POA: Diagnosis not present

## 2019-04-21 DIAGNOSIS — E7800 Pure hypercholesterolemia, unspecified: Secondary | ICD-10-CM | POA: Diagnosis not present

## 2019-04-21 DIAGNOSIS — Z1389 Encounter for screening for other disorder: Secondary | ICD-10-CM | POA: Diagnosis not present

## 2019-04-21 DIAGNOSIS — Z0000 Encounter for general adult medical examination without abnormal findings: Secondary | ICD-10-CM | POA: Diagnosis not present

## 2019-04-21 DIAGNOSIS — E1140 Type 2 diabetes mellitus with diabetic neuropathy, unspecified: Secondary | ICD-10-CM | POA: Diagnosis not present

## 2019-04-21 DIAGNOSIS — Z87891 Personal history of nicotine dependence: Secondary | ICD-10-CM | POA: Diagnosis not present

## 2019-04-28 DIAGNOSIS — R748 Abnormal levels of other serum enzymes: Secondary | ICD-10-CM | POA: Diagnosis not present

## 2019-05-24 DIAGNOSIS — R197 Diarrhea, unspecified: Secondary | ICD-10-CM | POA: Diagnosis not present

## 2019-06-29 DIAGNOSIS — Z79899 Other long term (current) drug therapy: Secondary | ICD-10-CM | POA: Diagnosis not present

## 2019-06-29 DIAGNOSIS — G4733 Obstructive sleep apnea (adult) (pediatric): Secondary | ICD-10-CM | POA: Diagnosis not present

## 2019-06-29 DIAGNOSIS — J4520 Mild intermittent asthma, uncomplicated: Secondary | ICD-10-CM | POA: Diagnosis not present

## 2019-06-29 DIAGNOSIS — R5383 Other fatigue: Secondary | ICD-10-CM | POA: Diagnosis not present

## 2019-06-29 DIAGNOSIS — E1122 Type 2 diabetes mellitus with diabetic chronic kidney disease: Secondary | ICD-10-CM | POA: Diagnosis not present

## 2019-06-29 DIAGNOSIS — N189 Chronic kidney disease, unspecified: Secondary | ICD-10-CM | POA: Diagnosis not present

## 2019-06-29 DIAGNOSIS — I129 Hypertensive chronic kidney disease with stage 1 through stage 4 chronic kidney disease, or unspecified chronic kidney disease: Secondary | ICD-10-CM | POA: Diagnosis not present

## 2019-07-08 DIAGNOSIS — Z79899 Other long term (current) drug therapy: Secondary | ICD-10-CM | POA: Diagnosis not present

## 2019-07-08 DIAGNOSIS — E1122 Type 2 diabetes mellitus with diabetic chronic kidney disease: Secondary | ICD-10-CM | POA: Diagnosis not present

## 2019-07-08 DIAGNOSIS — B20 Human immunodeficiency virus [HIV] disease: Secondary | ICD-10-CM | POA: Diagnosis not present

## 2019-07-08 DIAGNOSIS — N189 Chronic kidney disease, unspecified: Secondary | ICD-10-CM | POA: Diagnosis not present

## 2019-07-08 DIAGNOSIS — I129 Hypertensive chronic kidney disease with stage 1 through stage 4 chronic kidney disease, or unspecified chronic kidney disease: Secondary | ICD-10-CM | POA: Diagnosis not present

## 2019-07-22 DIAGNOSIS — J4540 Moderate persistent asthma, uncomplicated: Secondary | ICD-10-CM | POA: Diagnosis not present

## 2019-07-22 DIAGNOSIS — G4733 Obstructive sleep apnea (adult) (pediatric): Secondary | ICD-10-CM | POA: Diagnosis not present

## 2019-07-22 DIAGNOSIS — R5383 Other fatigue: Secondary | ICD-10-CM | POA: Diagnosis not present

## 2019-07-23 DIAGNOSIS — N1832 Chronic kidney disease, stage 3b: Secondary | ICD-10-CM | POA: Diagnosis not present

## 2019-07-23 DIAGNOSIS — E1121 Type 2 diabetes mellitus with diabetic nephropathy: Secondary | ICD-10-CM | POA: Diagnosis not present

## 2019-07-23 DIAGNOSIS — E1165 Type 2 diabetes mellitus with hyperglycemia: Secondary | ICD-10-CM | POA: Diagnosis not present

## 2019-07-23 DIAGNOSIS — R030 Elevated blood-pressure reading, without diagnosis of hypertension: Secondary | ICD-10-CM | POA: Diagnosis not present

## 2019-08-09 DIAGNOSIS — N183 Chronic kidney disease, stage 3 unspecified: Secondary | ICD-10-CM | POA: Diagnosis not present

## 2019-08-09 DIAGNOSIS — E785 Hyperlipidemia, unspecified: Secondary | ICD-10-CM | POA: Diagnosis not present

## 2019-08-09 DIAGNOSIS — Z7984 Long term (current) use of oral hypoglycemic drugs: Secondary | ICD-10-CM | POA: Diagnosis not present

## 2019-08-09 DIAGNOSIS — I129 Hypertensive chronic kidney disease with stage 1 through stage 4 chronic kidney disease, or unspecified chronic kidney disease: Secondary | ICD-10-CM | POA: Diagnosis not present

## 2019-08-09 DIAGNOSIS — E1122 Type 2 diabetes mellitus with diabetic chronic kidney disease: Secondary | ICD-10-CM | POA: Diagnosis not present

## 2019-08-09 DIAGNOSIS — Z79899 Other long term (current) drug therapy: Secondary | ICD-10-CM | POA: Diagnosis not present

## 2019-08-09 DIAGNOSIS — B20 Human immunodeficiency virus [HIV] disease: Secondary | ICD-10-CM | POA: Diagnosis not present

## 2019-08-18 ENCOUNTER — Other Ambulatory Visit: Payer: Self-pay

## 2019-08-18 NOTE — Patient Outreach (Signed)
[facility]) Care Management  08/18/2019  EDISSON PAVLOVICH 12-08-1955 [id number]   Medication Adherence call to Mr. Miguel Liang HIPPA Compliant Voice message left with a call back number. Mr. Tumminia is showing past due on Metformin Er 500 mg under [place].   [facility] Direct Dial [phone]  Fax [phone] [email or web address]

## 2019-08-19 DIAGNOSIS — R5383 Other fatigue: Secondary | ICD-10-CM | POA: Diagnosis not present

## 2019-08-19 DIAGNOSIS — E1122 Type 2 diabetes mellitus with diabetic chronic kidney disease: Secondary | ICD-10-CM | POA: Diagnosis not present

## 2019-08-19 DIAGNOSIS — Z79899 Other long term (current) drug therapy: Secondary | ICD-10-CM | POA: Diagnosis not present

## 2019-08-19 DIAGNOSIS — G4733 Obstructive sleep apnea (adult) (pediatric): Secondary | ICD-10-CM | POA: Diagnosis not present

## 2019-08-19 DIAGNOSIS — E785 Hyperlipidemia, unspecified: Secondary | ICD-10-CM | POA: Diagnosis not present

## 2019-08-19 DIAGNOSIS — N183 Chronic kidney disease, stage 3 unspecified: Secondary | ICD-10-CM | POA: Diagnosis not present

## 2019-08-19 DIAGNOSIS — J4540 Moderate persistent asthma, uncomplicated: Secondary | ICD-10-CM | POA: Diagnosis not present

## 2019-08-19 DIAGNOSIS — Z7984 Long term (current) use of oral hypoglycemic drugs: Secondary | ICD-10-CM | POA: Diagnosis not present

## 2019-08-19 DIAGNOSIS — I129 Hypertensive chronic kidney disease with stage 1 through stage 4 chronic kidney disease, or unspecified chronic kidney disease: Secondary | ICD-10-CM | POA: Diagnosis not present

## 2019-09-07 ENCOUNTER — Encounter: Payer: Self-pay | Admitting: Internal Medicine

## 2019-09-07 NOTE — Progress Notes (Signed)
Patient ID: Antonio Anderson, male   DOB: 1955-10-13, 64 y.o.   MRN: [id number] Patient of [facility] called to see if transferring to [facility]   Left message

## 2019-10-22 DIAGNOSIS — E1121 Type 2 diabetes mellitus with diabetic nephropathy: Secondary | ICD-10-CM | POA: Diagnosis not present

## 2019-10-22 DIAGNOSIS — E1165 Type 2 diabetes mellitus with hyperglycemia: Secondary | ICD-10-CM | POA: Diagnosis not present

## 2019-10-22 DIAGNOSIS — N1832 Chronic kidney disease, stage 3b: Secondary | ICD-10-CM | POA: Diagnosis not present

## 2019-11-11 DIAGNOSIS — G4733 Obstructive sleep apnea (adult) (pediatric): Secondary | ICD-10-CM | POA: Diagnosis not present

## 2019-11-11 DIAGNOSIS — R5383 Other fatigue: Secondary | ICD-10-CM | POA: Diagnosis not present

## 2019-11-11 DIAGNOSIS — J4540 Moderate persistent asthma, uncomplicated: Secondary | ICD-10-CM | POA: Diagnosis not present

## 2019-12-03 DIAGNOSIS — H6501 Acute serous otitis media, right ear: Secondary | ICD-10-CM | POA: Diagnosis not present

## 2020-01-04 DIAGNOSIS — N2581 Secondary hyperparathyroidism of renal origin: Secondary | ICD-10-CM | POA: Diagnosis not present

## 2020-01-04 DIAGNOSIS — D631 Anemia in chronic kidney disease: Secondary | ICD-10-CM | POA: Diagnosis not present

## 2020-01-04 DIAGNOSIS — E1122 Type 2 diabetes mellitus with diabetic chronic kidney disease: Secondary | ICD-10-CM | POA: Diagnosis not present

## 2020-01-04 DIAGNOSIS — N1832 Chronic kidney disease, stage 3b: Secondary | ICD-10-CM | POA: Diagnosis not present

## 2020-01-04 DIAGNOSIS — I129 Hypertensive chronic kidney disease with stage 1 through stage 4 chronic kidney disease, or unspecified chronic kidney disease: Secondary | ICD-10-CM | POA: Diagnosis not present

## 2020-01-04 DIAGNOSIS — N189 Chronic kidney disease, unspecified: Secondary | ICD-10-CM | POA: Diagnosis not present

## 2020-01-06 ENCOUNTER — Other Ambulatory Visit: Payer: Self-pay | Admitting: Nephrology

## 2020-01-06 DIAGNOSIS — N1832 Chronic kidney disease, stage 3b: Secondary | ICD-10-CM

## 2020-01-19 ENCOUNTER — Ambulatory Visit
Admission: RE | Admit: 2020-01-19 | Discharge: 2020-01-19 | Disposition: A | Discharge status: Home / Self Care | Payer: Medicare Other | Source: Ambulatory Visit | Attending: Nephrology | Admitting: Nephrology

## 2020-01-19 DIAGNOSIS — N183 Chronic kidney disease, stage 3 unspecified: Secondary | ICD-10-CM | POA: Diagnosis not present

## 2020-01-19 DIAGNOSIS — N1832 Chronic kidney disease, stage 3b: Secondary | ICD-10-CM

## 2020-01-27 ENCOUNTER — Other Ambulatory Visit: Payer: Self-pay | Admitting: Internal Medicine

## 2020-01-27 NOTE — Telephone Encounter (Signed)
Called and confirmed medication refusal to pharmacy. Patient has not been seen in this office.

## 2020-01-31 ENCOUNTER — Other Ambulatory Visit: Payer: Self-pay | Admitting: Infectious Disease

## 2020-02-12 ENCOUNTER — Other Ambulatory Visit: Payer: Self-pay | Admitting: Internal Medicine

## 2020-02-15 DIAGNOSIS — H6122 Impacted cerumen, left ear: Secondary | ICD-10-CM | POA: Diagnosis not present

## 2020-02-15 DIAGNOSIS — T161XXA Foreign body in right ear, initial encounter: Secondary | ICD-10-CM | POA: Diagnosis not present

## 2020-02-15 DIAGNOSIS — H6091 Unspecified otitis externa, right ear: Secondary | ICD-10-CM | POA: Diagnosis not present

## 2020-02-15 DIAGNOSIS — H61321 Acquired stenosis of right external ear canal secondary to inflammation and infection: Secondary | ICD-10-CM | POA: Diagnosis not present

## 2020-02-17 ENCOUNTER — Other Ambulatory Visit: Payer: Self-pay | Admitting: Internal Medicine

## 2020-02-25 DIAGNOSIS — G8929 Other chronic pain: Secondary | ICD-10-CM | POA: Diagnosis not present

## 2020-02-25 DIAGNOSIS — J029 Acute pharyngitis, unspecified: Secondary | ICD-10-CM | POA: Diagnosis not present

## 2020-02-25 DIAGNOSIS — R519 Headache, unspecified: Secondary | ICD-10-CM | POA: Diagnosis not present

## 2020-03-02 DIAGNOSIS — N1832 Chronic kidney disease, stage 3b: Secondary | ICD-10-CM | POA: Diagnosis not present

## 2020-03-02 DIAGNOSIS — H9201 Otalgia, right ear: Secondary | ICD-10-CM | POA: Diagnosis not present

## 2020-03-02 DIAGNOSIS — E1165 Type 2 diabetes mellitus with hyperglycemia: Secondary | ICD-10-CM | POA: Diagnosis not present

## 2020-03-02 DIAGNOSIS — Z23 Encounter for immunization: Secondary | ICD-10-CM | POA: Diagnosis not present

## 2020-03-02 DIAGNOSIS — E1121 Type 2 diabetes mellitus with diabetic nephropathy: Secondary | ICD-10-CM | POA: Diagnosis not present

## 2020-03-03 DIAGNOSIS — H9201 Otalgia, right ear: Secondary | ICD-10-CM | POA: Diagnosis not present

## 2020-03-03 DIAGNOSIS — H6091 Unspecified otitis externa, right ear: Secondary | ICD-10-CM | POA: Diagnosis not present

## 2020-03-04 ENCOUNTER — Other Ambulatory Visit: Payer: Self-pay | Admitting: Internal Medicine

## 2020-03-22 DIAGNOSIS — R569 Unspecified convulsions: Secondary | ICD-10-CM | POA: Diagnosis not present

## 2020-03-22 DIAGNOSIS — E7800 Pure hypercholesterolemia, unspecified: Secondary | ICD-10-CM | POA: Diagnosis not present

## 2020-03-22 DIAGNOSIS — N1831 Chronic kidney disease, stage 3a: Secondary | ICD-10-CM | POA: Diagnosis not present

## 2020-03-22 DIAGNOSIS — E1140 Type 2 diabetes mellitus with diabetic neuropathy, unspecified: Secondary | ICD-10-CM | POA: Diagnosis not present

## 2020-03-22 DIAGNOSIS — R55 Syncope and collapse: Secondary | ICD-10-CM | POA: Diagnosis not present

## 2020-03-22 DIAGNOSIS — I10 Essential (primary) hypertension: Secondary | ICD-10-CM | POA: Diagnosis not present

## 2020-03-22 DIAGNOSIS — M1990 Unspecified osteoarthritis, unspecified site: Secondary | ICD-10-CM | POA: Diagnosis not present

## 2020-03-22 DIAGNOSIS — Z880 Allergy status to penicillin: Secondary | ICD-10-CM | POA: Diagnosis not present

## 2020-03-22 DIAGNOSIS — R634 Abnormal weight loss: Secondary | ICD-10-CM | POA: Diagnosis not present

## 2020-03-22 DIAGNOSIS — E1122 Type 2 diabetes mellitus with diabetic chronic kidney disease: Secondary | ICD-10-CM | POA: Diagnosis not present

## 2020-03-22 DIAGNOSIS — Z885 Allergy status to narcotic agent status: Secondary | ICD-10-CM | POA: Diagnosis not present

## 2020-03-22 DIAGNOSIS — G9349 Other encephalopathy: Secondary | ICD-10-CM | POA: Diagnosis not present

## 2020-03-22 DIAGNOSIS — J449 Chronic obstructive pulmonary disease, unspecified: Secondary | ICD-10-CM | POA: Diagnosis not present

## 2020-03-22 DIAGNOSIS — E1142 Type 2 diabetes mellitus with diabetic polyneuropathy: Secondary | ICD-10-CM | POA: Diagnosis not present

## 2020-03-22 DIAGNOSIS — F17210 Nicotine dependence, cigarettes, uncomplicated: Secondary | ICD-10-CM | POA: Diagnosis not present

## 2020-03-22 DIAGNOSIS — Z79899 Other long term (current) drug therapy: Secondary | ICD-10-CM | POA: Diagnosis not present

## 2020-03-22 DIAGNOSIS — N179 Acute kidney failure, unspecified: Secondary | ICD-10-CM | POA: Diagnosis not present

## 2020-03-22 DIAGNOSIS — Z794 Long term (current) use of insulin: Secondary | ICD-10-CM | POA: Diagnosis not present

## 2020-03-23 DIAGNOSIS — R569 Unspecified convulsions: Secondary | ICD-10-CM | POA: Diagnosis not present

## 2020-03-23 DIAGNOSIS — E1142 Type 2 diabetes mellitus with diabetic polyneuropathy: Secondary | ICD-10-CM | POA: Diagnosis not present

## 2020-03-24 DIAGNOSIS — E876 Hypokalemia: Secondary | ICD-10-CM | POA: Diagnosis not present

## 2020-03-24 DIAGNOSIS — E872 Acidosis: Secondary | ICD-10-CM | POA: Diagnosis not present

## 2020-03-24 DIAGNOSIS — I129 Hypertensive chronic kidney disease with stage 1 through stage 4 chronic kidney disease, or unspecified chronic kidney disease: Secondary | ICD-10-CM | POA: Diagnosis not present

## 2020-03-24 DIAGNOSIS — M2550 Pain in unspecified joint: Secondary | ICD-10-CM | POA: Diagnosis not present

## 2020-03-24 DIAGNOSIS — E785 Hyperlipidemia, unspecified: Secondary | ICD-10-CM | POA: Diagnosis not present

## 2020-03-24 DIAGNOSIS — E1140 Type 2 diabetes mellitus with diabetic neuropathy, unspecified: Secondary | ICD-10-CM | POA: Diagnosis not present

## 2020-03-24 DIAGNOSIS — R55 Syncope and collapse: Secondary | ICD-10-CM | POA: Diagnosis not present

## 2020-03-24 DIAGNOSIS — K219 Gastro-esophageal reflux disease without esophagitis: Secondary | ICD-10-CM | POA: Diagnosis not present

## 2020-03-24 DIAGNOSIS — R296 Repeated falls: Secondary | ICD-10-CM | POA: Diagnosis not present

## 2020-03-24 DIAGNOSIS — R569 Unspecified convulsions: Secondary | ICD-10-CM | POA: Diagnosis not present

## 2020-03-24 DIAGNOSIS — H748X1 Other specified disorders of right middle ear and mastoid: Secondary | ICD-10-CM | POA: Diagnosis not present

## 2020-03-24 DIAGNOSIS — G9389 Other specified disorders of brain: Secondary | ICD-10-CM | POA: Diagnosis not present

## 2020-03-24 DIAGNOSIS — Z885 Allergy status to narcotic agent status: Secondary | ICD-10-CM | POA: Diagnosis not present

## 2020-03-24 DIAGNOSIS — Z7401 Bed confinement status: Secondary | ICD-10-CM | POA: Diagnosis not present

## 2020-03-24 DIAGNOSIS — E1122 Type 2 diabetes mellitus with diabetic chronic kidney disease: Secondary | ICD-10-CM | POA: Diagnosis not present

## 2020-03-24 DIAGNOSIS — F17210 Nicotine dependence, cigarettes, uncomplicated: Secondary | ICD-10-CM | POA: Diagnosis not present

## 2020-03-24 DIAGNOSIS — N179 Acute kidney failure, unspecified: Secondary | ICD-10-CM | POA: Diagnosis not present

## 2020-03-24 DIAGNOSIS — Z880 Allergy status to penicillin: Secondary | ICD-10-CM | POA: Diagnosis not present

## 2020-03-24 DIAGNOSIS — Z743 Need for continuous supervision: Secondary | ICD-10-CM | POA: Diagnosis not present

## 2020-03-24 DIAGNOSIS — R404 Transient alteration of awareness: Secondary | ICD-10-CM | POA: Diagnosis not present

## 2020-03-24 DIAGNOSIS — D519 Vitamin B12 deficiency anemia, unspecified: Secondary | ICD-10-CM | POA: Diagnosis not present

## 2020-03-24 DIAGNOSIS — I499 Cardiac arrhythmia, unspecified: Secondary | ICD-10-CM | POA: Diagnosis not present

## 2020-03-24 DIAGNOSIS — G9340 Encephalopathy, unspecified: Secondary | ICD-10-CM | POA: Diagnosis not present

## 2020-03-24 DIAGNOSIS — I10 Essential (primary) hypertension: Secondary | ICD-10-CM | POA: Diagnosis not present

## 2020-03-24 DIAGNOSIS — R269 Unspecified abnormalities of gait and mobility: Secondary | ICD-10-CM | POA: Diagnosis not present

## 2020-03-24 DIAGNOSIS — M6282 Rhabdomyolysis: Secondary | ICD-10-CM | POA: Diagnosis not present

## 2020-03-24 DIAGNOSIS — E538 Deficiency of other specified B group vitamins: Secondary | ICD-10-CM | POA: Diagnosis not present

## 2020-03-24 DIAGNOSIS — N183 Chronic kidney disease, stage 3 unspecified: Secondary | ICD-10-CM | POA: Diagnosis not present

## 2020-03-24 DIAGNOSIS — J449 Chronic obstructive pulmonary disease, unspecified: Secondary | ICD-10-CM | POA: Diagnosis not present

## 2020-03-24 DIAGNOSIS — M2548 Effusion, other site: Secondary | ICD-10-CM | POA: Diagnosis not present

## 2020-03-24 DIAGNOSIS — W19XXXA Unspecified fall, initial encounter: Secondary | ICD-10-CM | POA: Diagnosis not present

## 2020-03-25 DIAGNOSIS — G9340 Encephalopathy, unspecified: Secondary | ICD-10-CM | POA: Diagnosis not present

## 2020-03-25 DIAGNOSIS — R55 Syncope and collapse: Secondary | ICD-10-CM | POA: Diagnosis not present

## 2020-03-25 DIAGNOSIS — E538 Deficiency of other specified B group vitamins: Secondary | ICD-10-CM | POA: Diagnosis not present

## 2020-04-01 DIAGNOSIS — N179 Acute kidney failure, unspecified: Secondary | ICD-10-CM | POA: Diagnosis not present

## 2020-04-01 DIAGNOSIS — E119 Type 2 diabetes mellitus without complications: Secondary | ICD-10-CM | POA: Diagnosis not present

## 2020-04-01 DIAGNOSIS — M2550 Pain in unspecified joint: Secondary | ICD-10-CM | POA: Diagnosis not present

## 2020-04-01 DIAGNOSIS — E785 Hyperlipidemia, unspecified: Secondary | ICD-10-CM | POA: Diagnosis not present

## 2020-04-01 DIAGNOSIS — R404 Transient alteration of awareness: Secondary | ICD-10-CM | POA: Diagnosis not present

## 2020-04-01 DIAGNOSIS — E1140 Type 2 diabetes mellitus with diabetic neuropathy, unspecified: Secondary | ICD-10-CM | POA: Diagnosis not present

## 2020-04-01 DIAGNOSIS — Z7901 Long term (current) use of anticoagulants: Secondary | ICD-10-CM | POA: Diagnosis not present

## 2020-04-01 DIAGNOSIS — D72829 Elevated white blood cell count, unspecified: Secondary | ICD-10-CM | POA: Diagnosis not present

## 2020-04-01 DIAGNOSIS — R339 Retention of urine, unspecified: Secondary | ICD-10-CM | POA: Diagnosis not present

## 2020-04-01 DIAGNOSIS — M6282 Rhabdomyolysis: Secondary | ICD-10-CM | POA: Diagnosis not present

## 2020-04-01 DIAGNOSIS — E876 Hypokalemia: Secondary | ICD-10-CM | POA: Diagnosis not present

## 2020-04-01 DIAGNOSIS — D519 Vitamin B12 deficiency anemia, unspecified: Secondary | ICD-10-CM | POA: Diagnosis not present

## 2020-04-01 DIAGNOSIS — Z960 Presence of urogenital implants: Secondary | ICD-10-CM | POA: Diagnosis not present

## 2020-04-01 DIAGNOSIS — W19XXXA Unspecified fall, initial encounter: Secondary | ICD-10-CM | POA: Diagnosis not present

## 2020-04-01 DIAGNOSIS — G4733 Obstructive sleep apnea (adult) (pediatric): Secondary | ICD-10-CM | POA: Diagnosis not present

## 2020-04-01 DIAGNOSIS — I10 Essential (primary) hypertension: Secondary | ICD-10-CM | POA: Diagnosis not present

## 2020-04-01 DIAGNOSIS — Z7401 Bed confinement status: Secondary | ICD-10-CM | POA: Diagnosis not present

## 2020-04-03 DIAGNOSIS — E1140 Type 2 diabetes mellitus with diabetic neuropathy, unspecified: Secondary | ICD-10-CM | POA: Diagnosis not present

## 2020-04-03 DIAGNOSIS — M6282 Rhabdomyolysis: Secondary | ICD-10-CM | POA: Diagnosis not present

## 2020-04-06 DIAGNOSIS — Z960 Presence of urogenital implants: Secondary | ICD-10-CM | POA: Diagnosis not present

## 2020-04-06 DIAGNOSIS — E119 Type 2 diabetes mellitus without complications: Secondary | ICD-10-CM | POA: Diagnosis not present

## 2020-04-10 DIAGNOSIS — E119 Type 2 diabetes mellitus without complications: Secondary | ICD-10-CM | POA: Diagnosis not present

## 2020-04-13 DIAGNOSIS — Z7901 Long term (current) use of anticoagulants: Secondary | ICD-10-CM | POA: Diagnosis not present

## 2020-04-13 DIAGNOSIS — E119 Type 2 diabetes mellitus without complications: Secondary | ICD-10-CM | POA: Diagnosis not present

## 2020-04-13 DIAGNOSIS — R339 Retention of urine, unspecified: Secondary | ICD-10-CM | POA: Diagnosis not present

## 2020-04-22 DIAGNOSIS — G4733 Obstructive sleep apnea (adult) (pediatric): Secondary | ICD-10-CM | POA: Diagnosis not present

## 2020-04-22 DIAGNOSIS — E785 Hyperlipidemia, unspecified: Secondary | ICD-10-CM | POA: Diagnosis not present

## 2020-04-22 DIAGNOSIS — E119 Type 2 diabetes mellitus without complications: Secondary | ICD-10-CM | POA: Diagnosis not present

## 2020-04-22 DIAGNOSIS — D72829 Elevated white blood cell count, unspecified: Secondary | ICD-10-CM | POA: Diagnosis not present

## 2020-05-25 DIAGNOSIS — E1140 Type 2 diabetes mellitus with diabetic neuropathy, unspecified: Secondary | ICD-10-CM | POA: Diagnosis not present

## 2020-05-25 DIAGNOSIS — M6282 Rhabdomyolysis: Secondary | ICD-10-CM | POA: Diagnosis not present

## 2020-05-25 DIAGNOSIS — H1089 Other conjunctivitis: Secondary | ICD-10-CM | POA: Diagnosis not present

## 2020-12-08 DEATH — deceased

## 2021-07-12 IMAGING — US US RENAL
1 series · 14 of 25 positions shown · non-contrast
Comparison: None.

CLINICAL DATA: Initial evaluation for chronic kidney disease, stage
III.

EXAM:
RENAL / URINARY TRACT ULTRASOUND COMPLETE

[Series 1: us renal · 0.23mm/px · 14 of 37 slices shown]
[im 1/37]
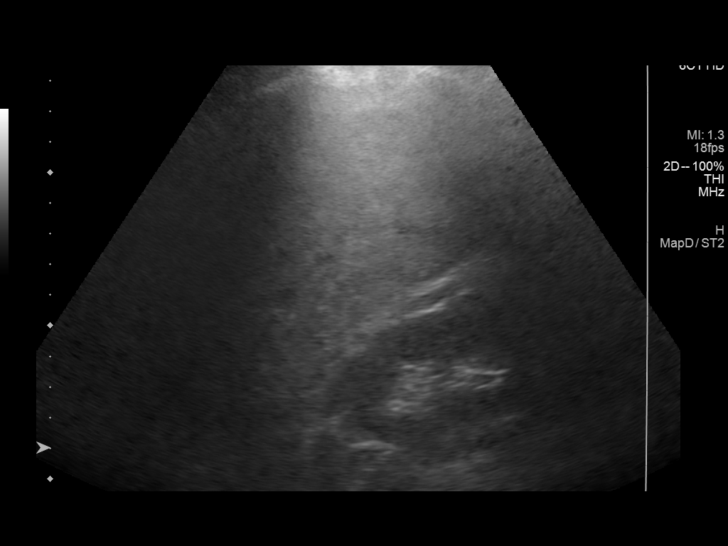
[im 4/37]
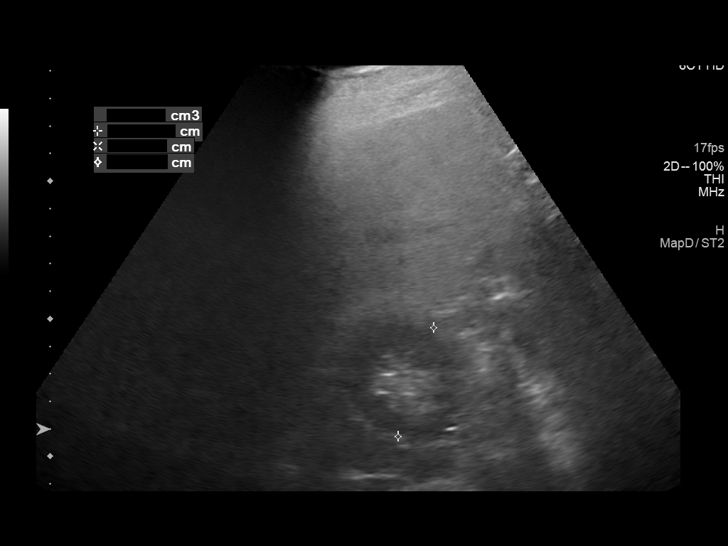
[im 7/37]
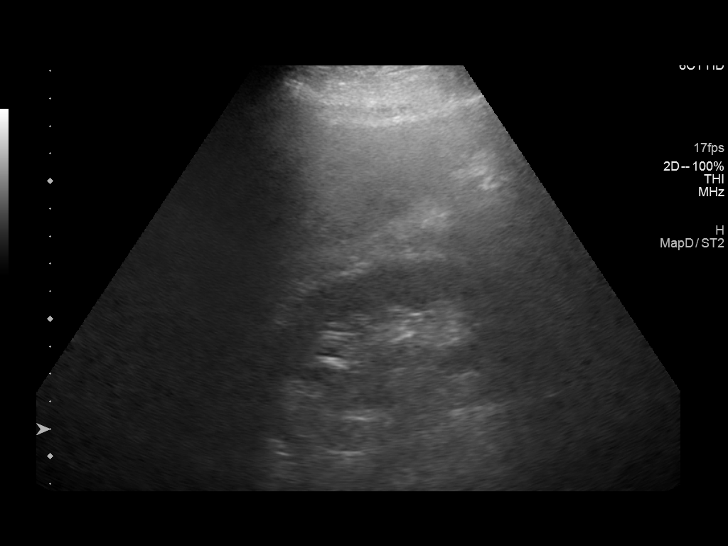
[im 10/37]
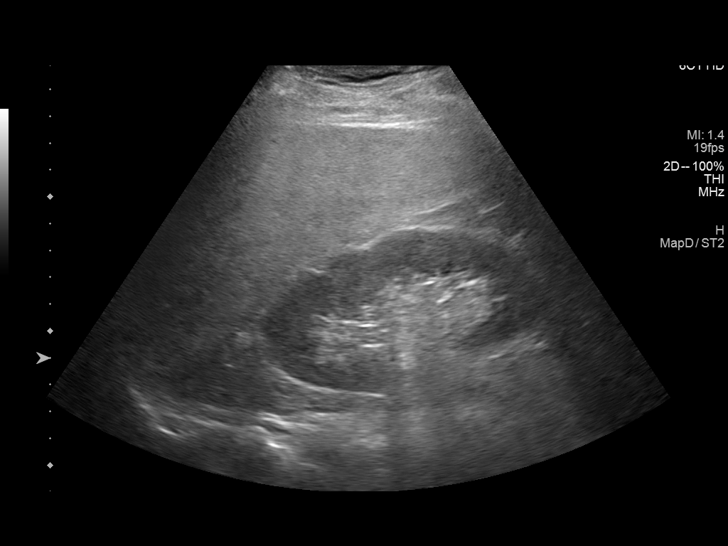
[im 13/37]
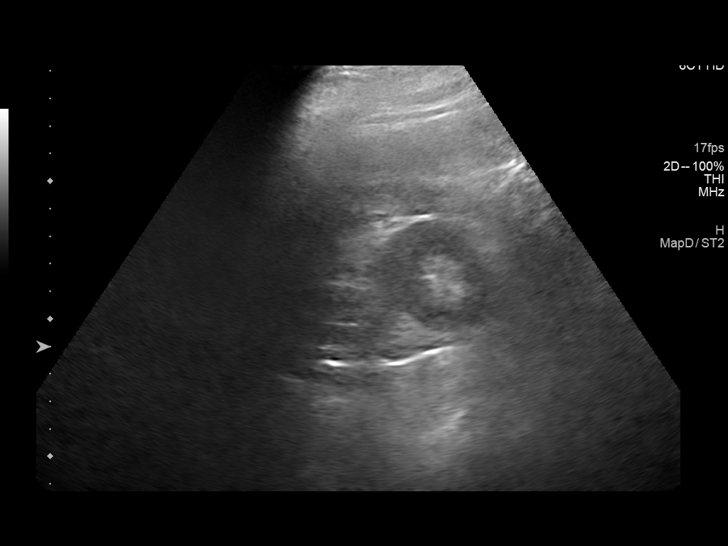
[im 14/37]
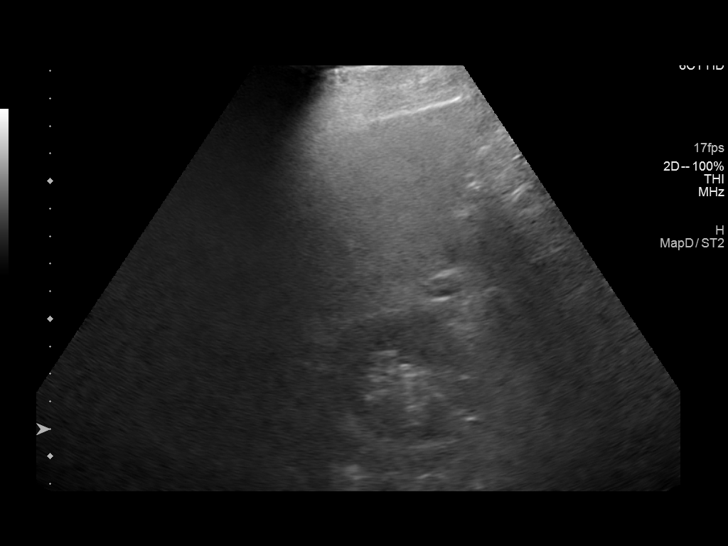
[im 17/37]
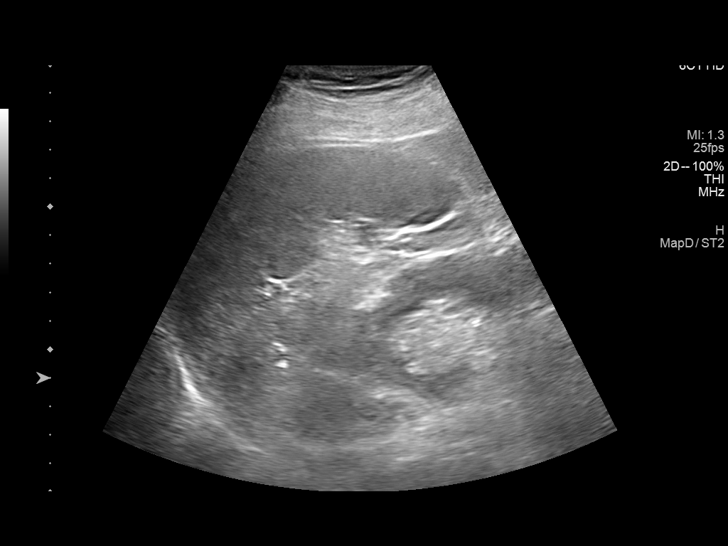
[im 20/37]
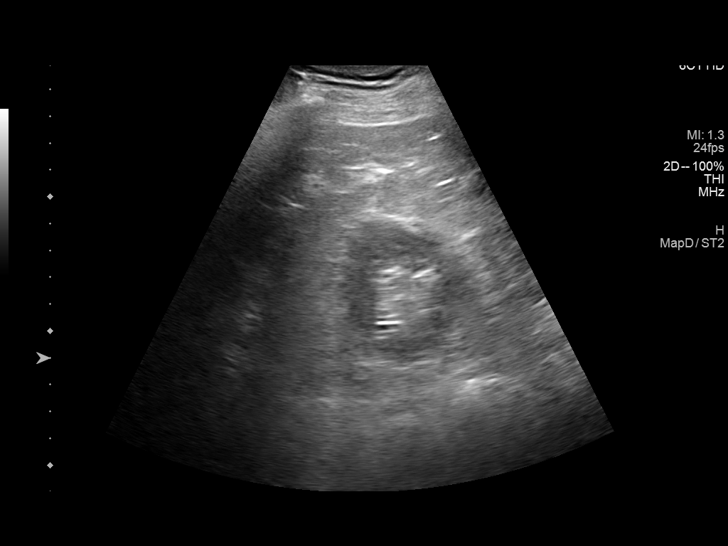
[im 23/37]
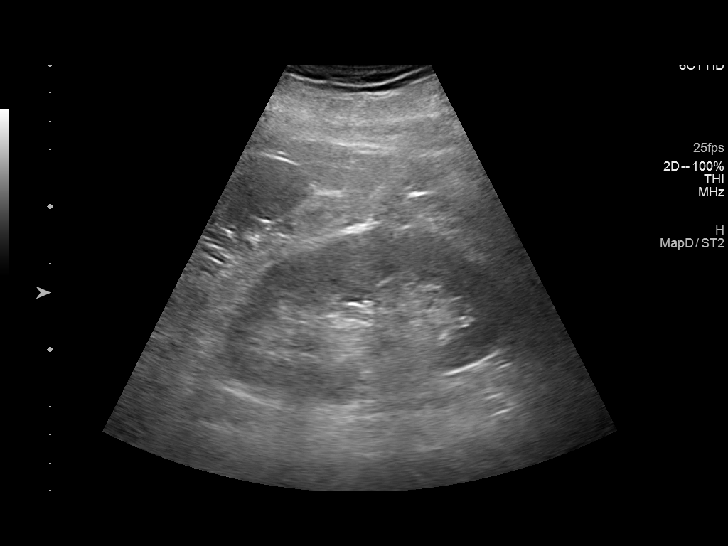
[im 25/37]
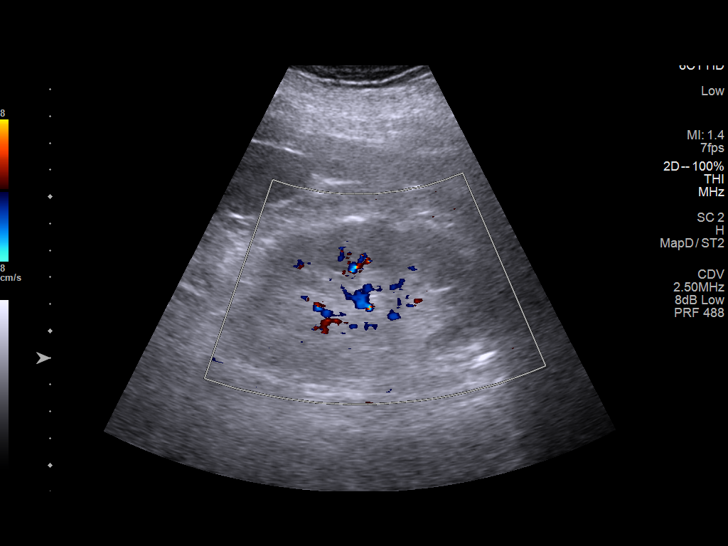
[im 28/37]
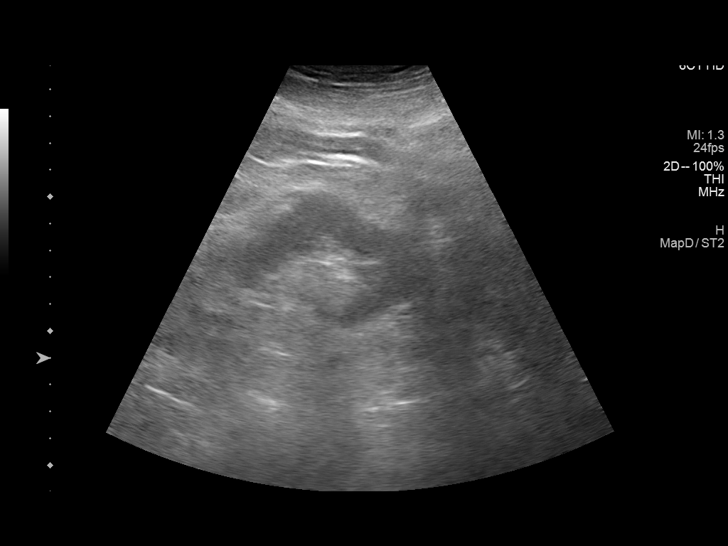
[im 31/37]
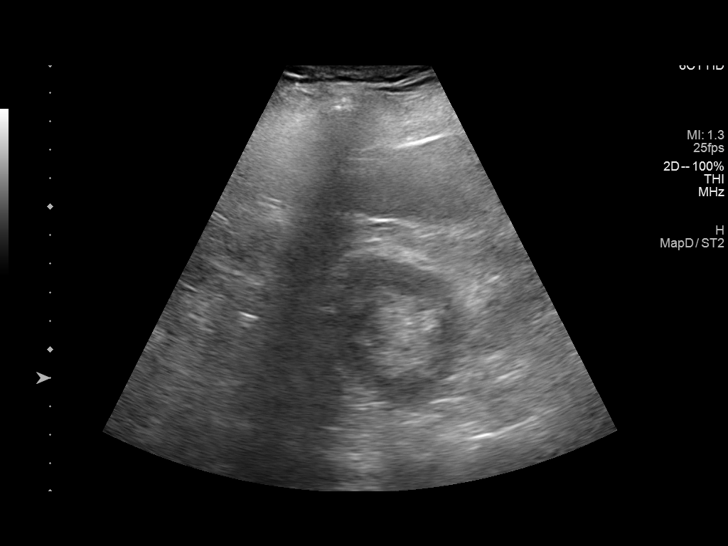
[im 34/37]
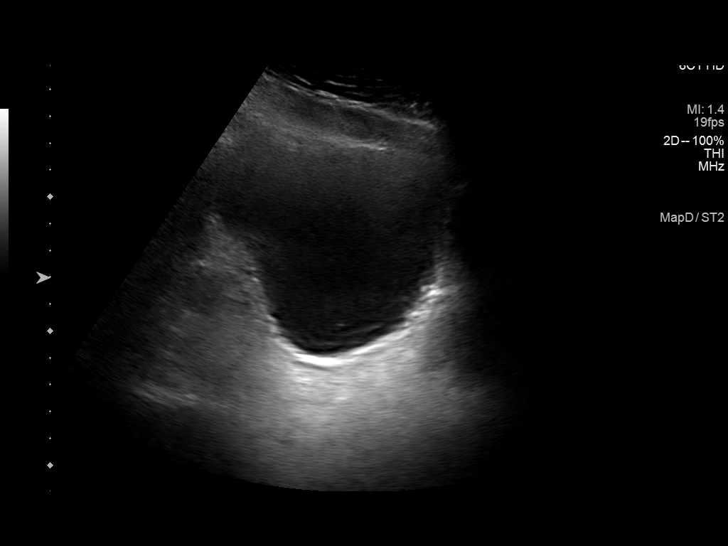
[im 37/37]
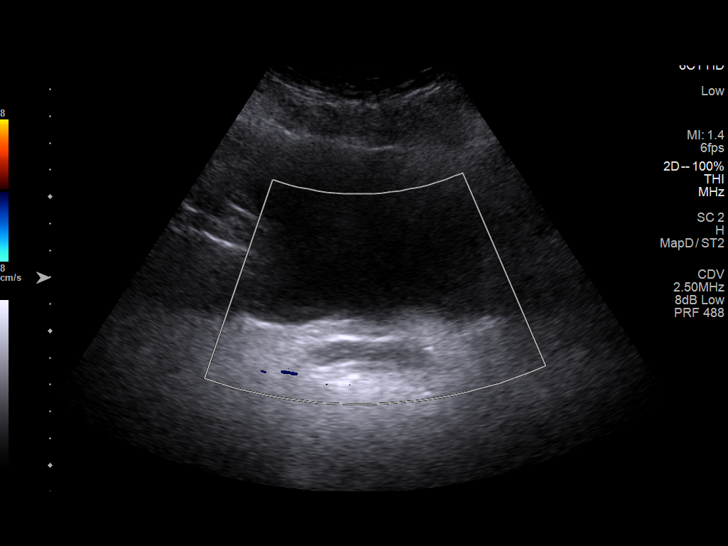

[14 of 25 positions shown; findings below may reference images not displayed]

FINDINGS: Right Kidney:

Renal measurements: 10.5 x 5.3 x 4.2 cm = volume: 121.3 mL. Renal
echogenicity within normal limits. No nephrolithiasis or
hydronephrosis. No focal renal mass.

Left Kidney:

Renal measurements: 11.4 x 5.2 x 5.0 cm = volume: 154.5 mL. Renal
echogenicity within normal limits. No nephrolithiasis or
hydronephrosis. No focal renal mass.

Bladder:

Appears normal for degree of bladder distention.

Other:

Incidental note made of diffusely increased echogenicity within the
visualized patent parenchyma.
IMPRESSION: 1. Normal renal ultrasound. No hydronephrosis or other significant
finding.
2. Incidental increased echogenicity within the hepatic parenchyma,
suggesting steatosis or other intrinsic hepatocellular disease.
Correlation with LFTs suggested.
